# Patient Record
Sex: Female | Born: 1945 | Race: White | Hispanic: No | Marital: Single
Health system: Southern US, Community
[De-identification: ages and names within clinical notes are randomized; demographics above are authoritative.]

---

## 2001-02-11 ENCOUNTER — Ambulatory Visit (HOSPITAL_COMMUNITY): Admission: RE | Admit: 2001-02-11 | Discharge: 2001-02-11 | Payer: Self-pay | Admitting: Neurology

## 2001-02-11 ENCOUNTER — Encounter: Payer: Self-pay | Admitting: Neurology

## 2001-07-20 ENCOUNTER — Inpatient Hospital Stay (HOSPITAL_COMMUNITY): Admission: AD | Admit: 2001-07-20 | Discharge: 2001-07-22 | Payer: Self-pay | Admitting: Neurology

## 2002-12-31 ENCOUNTER — Ambulatory Visit (HOSPITAL_COMMUNITY): Admission: RE | Admit: 2002-12-31 | Discharge: 2002-12-31 | Payer: Self-pay | Admitting: Neurology

## 2002-12-31 ENCOUNTER — Encounter: Payer: Self-pay | Admitting: Neurology

## 2004-04-03 ENCOUNTER — Ambulatory Visit: Payer: Self-pay | Admitting: Family Medicine

## 2005-04-23 ENCOUNTER — Ambulatory Visit: Payer: Self-pay | Admitting: Family Medicine

## 2005-08-03 ENCOUNTER — Emergency Department: Payer: Self-pay | Admitting: Emergency Medicine

## 2005-08-03 ENCOUNTER — Other Ambulatory Visit: Payer: Self-pay

## 2006-04-27 ENCOUNTER — Ambulatory Visit: Payer: Self-pay | Admitting: Family Medicine

## 2006-08-30 ENCOUNTER — Observation Stay: Payer: Self-pay | Admitting: Internal Medicine

## 2006-08-30 ENCOUNTER — Other Ambulatory Visit: Payer: Self-pay

## 2007-05-03 ENCOUNTER — Ambulatory Visit: Payer: Self-pay | Admitting: Family Medicine

## 2008-05-03 ENCOUNTER — Ambulatory Visit: Payer: Self-pay | Admitting: Family Medicine

## 2009-05-13 ENCOUNTER — Ambulatory Visit: Payer: Self-pay | Admitting: Family Medicine

## 2009-06-17 ENCOUNTER — Ambulatory Visit: Payer: Self-pay | Admitting: Family Medicine

## 2010-05-27 ENCOUNTER — Ambulatory Visit: Payer: Self-pay | Admitting: Family Medicine

## 2010-12-03 ENCOUNTER — Other Ambulatory Visit: Payer: Self-pay | Admitting: Neurology

## 2010-12-03 DIAGNOSIS — G40309 Generalized idiopathic epilepsy and epileptic syndromes, not intractable, without status epilepticus: Secondary | ICD-10-CM

## 2010-12-15 ENCOUNTER — Ambulatory Visit
Admission: RE | Admit: 2010-12-15 | Discharge: 2010-12-15 | Disposition: A | Discharge status: Home / Self Care | Payer: Medicare Other | Source: Ambulatory Visit | Attending: Neurology | Admitting: Neurology

## 2010-12-15 DIAGNOSIS — G40309 Generalized idiopathic epilepsy and epileptic syndromes, not intractable, without status epilepticus: Secondary | ICD-10-CM

## 2010-12-15 MED ORDER — GADOBENATE DIMEGLUMINE 529 MG/ML IV SOLN
9.0000 mL | Freq: Once | INTRAVENOUS | Status: AC | PRN
  Administered 2010-12-15: 9 mL via INTRAVENOUS

## 2011-06-26 ENCOUNTER — Ambulatory Visit: Payer: Self-pay | Admitting: Family Medicine

## 2012-03-15 ENCOUNTER — Ambulatory Visit: Payer: Self-pay | Admitting: Radiation Oncology

## 2012-03-17 ENCOUNTER — Ambulatory Visit: Payer: Self-pay | Admitting: Family Medicine

## 2012-03-21 ENCOUNTER — Inpatient Hospital Stay: Payer: Self-pay | Admitting: Internal Medicine

## 2012-03-21 LAB — CBC
HCT: 21.1 % — ABNORMAL LOW (ref 35.0–47.0)
HGB: 7.3 g/dL — ABNORMAL LOW (ref 12.0–16.0)
MCH: 30.2 pg (ref 26.0–34.0)
MCHC: 34.8 g/dL (ref 32.0–36.0)
MCV: 87 fL (ref 80–100)
Platelet: 109 10*3/uL — ABNORMAL LOW (ref 150–440)
RBC: 2.44 10*6/uL — ABNORMAL LOW (ref 3.80–5.20)
RDW: 13.5 % (ref 11.5–14.5)
WBC: 8.3 10*3/uL (ref 3.6–11.0)

## 2012-03-21 LAB — COMPREHENSIVE METABOLIC PANEL
Albumin: 3.3 g/dL — ABNORMAL LOW (ref 3.4–5.0)
Alkaline Phosphatase: 658 U/L — ABNORMAL HIGH (ref 50–136)
Anion Gap: 11 (ref 7–16)
BUN: 11 mg/dL (ref 7–18)
Bilirubin,Total: 0.3 mg/dL (ref 0.2–1.0)
Calcium, Total: 7.6 mg/dL — ABNORMAL LOW (ref 8.5–10.1)
Chloride: 103 mmol/L (ref 98–107)
Co2: 21 mmol/L (ref 21–32)
Creatinine: 0.66 mg/dL (ref 0.60–1.30)
EGFR (African American): >60
EGFR (Non-African Amer.): >60
Glucose: 104 mg/dL — ABNORMAL HIGH (ref 65–99)
Osmolality: 270 (ref 275–301)
Potassium: 3.6 mmol/L (ref 3.5–5.1)
SGOT(AST): 36 U/L (ref 15–37)
SGPT (ALT): 18 U/L (ref 12–78)
Sodium: 135 mmol/L — ABNORMAL LOW (ref 136–145)
Total Protein: 6.2 g/dL — ABNORMAL LOW (ref 6.4–8.2)

## 2012-03-21 LAB — URINALYSIS, COMPLETE
Bilirubin,UR: NEGATIVE
Glucose,UR: NEGATIVE mg/dL (ref 0–75)
Ketone: NEGATIVE
Specific Gravity: 1.017 (ref 1.003–1.030)

## 2012-03-21 LAB — PROTIME-INR
INR: 1.2
Prothrombin Time: 15.5 secs — ABNORMAL HIGH (ref 11.5–14.7)

## 2012-03-21 LAB — APTT: Activated PTT: 31.3 secs (ref 23.6–35.9)

## 2012-03-21 LAB — HEMOGLOBIN: HGB: 6.5 g/dL — ABNORMAL LOW (ref 12.0–16.0)

## 2012-03-22 LAB — COMPREHENSIVE METABOLIC PANEL
Albumin: 2.5 g/dL — ABNORMAL LOW (ref 3.4–5.0)
Alkaline Phosphatase: 569 U/L — ABNORMAL HIGH (ref 50–136)
BUN: 8 mg/dL (ref 7–18)
Bilirubin,Total: 0.3 mg/dL (ref 0.2–1.0)
Calcium, Total: 7.1 mg/dL — ABNORMAL LOW (ref 8.5–10.1)
Co2: 18 mmol/L — ABNORMAL LOW (ref 21–32)
Creatinine: 0.6 mg/dL (ref 0.60–1.30)
EGFR (African American): >60
EGFR (Non-African Amer.): >60
Osmolality: 284 (ref 275–301)
Potassium: 3.6 mmol/L (ref 3.5–5.1)
SGOT(AST): 26 U/L (ref 15–37)
SGPT (ALT): 16 U/L (ref 12–78)
Sodium: 144 mmol/L (ref 136–145)

## 2012-03-22 LAB — CBC WITH DIFFERENTIAL/PLATELET
Basophil %: 0.2 %
Eosinophil #: 0.1 10*3/uL (ref 0.0–0.7)
HCT: 26.7 % — ABNORMAL LOW (ref 35.0–47.0)
HGB: 9.4 g/dL — ABNORMAL LOW (ref 12.0–16.0)
Lymphocyte #: 0.9 10*3/uL — ABNORMAL LOW (ref 1.0–3.6)
MCHC: 35.2 g/dL (ref 32.0–36.0)
MCV: 85 fL (ref 80–100)
Monocyte %: 4.3 %
Neutrophil #: 5.1 10*3/uL (ref 1.4–6.5)
RBC: 3.13 10*6/uL — ABNORMAL LOW (ref 3.80–5.20)
RDW: 13.1 % (ref 11.5–14.5)
WBC: 6.4 10*3/uL (ref 3.6–11.0)

## 2012-03-22 LAB — HEMOGLOBIN: HGB: 9.5 g/dL — ABNORMAL LOW (ref 12.0–16.0)

## 2012-03-23 ENCOUNTER — Ambulatory Visit: Payer: Self-pay | Admitting: Radiation Oncology

## 2012-03-23 LAB — CBC WITH DIFFERENTIAL/PLATELET
Bands: 4 %
Eosinophil: 3 %
HCT: 27.6 % — ABNORMAL LOW (ref 35.0–47.0)
Lymphocytes: 14 %
MCV: 85 fL (ref 80–100)
Metamyelocyte: 2 %
Monocytes: 5 %
Myelocyte: 1 %
RBC: 3.24 10*6/uL — ABNORMAL LOW (ref 3.80–5.20)
Variant Lymphocyte - H1-Rlymph: 2 %
WBC: 7.7 10*3/uL (ref 3.6–11.0)

## 2012-03-23 LAB — PROTIME-INR
INR: 1.2
Prothrombin Time: 16 secs — ABNORMAL HIGH (ref 11.5–14.7)

## 2012-03-23 LAB — PLATELET COUNT: Platelet: 69 10*3/uL — ABNORMAL LOW (ref 150–440)

## 2012-03-24 LAB — CBC WITH DIFFERENTIAL/PLATELET
Comment - H1-Com4: NORMAL
Eosinophil: 1 %
Lymphocytes: 10 %
MCHC: 34.7 g/dL (ref 32.0–36.0)
MCV: 86 fL (ref 80–100)
Monocytes: 2 %
Platelet: 83 10*3/uL — ABNORMAL LOW (ref 150–440)
RBC: 3.61 10*6/uL — ABNORMAL LOW (ref 3.80–5.20)
WBC: 8.2 10*3/uL (ref 3.6–11.0)

## 2012-03-24 LAB — ALKALINE PHOSPHATASE: Alkaline Phosphatase: 871 U/L — ABNORMAL HIGH (ref 50–136)

## 2012-03-25 LAB — CBC WITH DIFFERENTIAL/PLATELET
Bands: 3 %
Eosinophil: 2 %
HCT: 21 % — ABNORMAL LOW (ref 35.0–47.0)
HCT: 20.5 % — ABNORMAL LOW (ref 35.0–47.0)
HGB: 7.7 g/dL — ABNORMAL LOW (ref 12.0–16.0)
HGB: 7.2 g/dL — ABNORMAL LOW (ref 12.0–16.0)
Lymphocyte %: 10.6 %
Lymphocytes: 6 %
Lymphocytes: 12 %
MCH: 31.2 pg (ref 26.0–34.0)
MCH: 30.3 pg (ref 26.0–34.0)
MCHC: 36.8 g/dL — ABNORMAL HIGH (ref 32.0–36.0)
MCHC: 35.2 g/dL (ref 32.0–36.0)
MCV: 85 fL (ref 80–100)
MCV: 86 fL (ref 80–100)
Metamyelocyte: 1 %
Monocytes: 3 %
Monocytes: 1 %
Myelocyte: 1 %
Myelocyte: 2 %
NRBC/100 WBC: 1 /
Neutrophil #: 5.6 10*3/uL (ref 1.4–6.5)
Neutrophil %: 83.7 %
Platelet: 61 10*3/uL — ABNORMAL LOW (ref 150–440)
Platelet: 65 10*3/uL — ABNORMAL LOW (ref 150–440)
RBC: 2.47 10*6/uL — ABNORMAL LOW (ref 3.80–5.20)
RBC: 2.39 10*6/uL — ABNORMAL LOW (ref 3.80–5.20)
RDW: 13.8 % (ref 11.5–14.5)
RDW: 14 % (ref 11.5–14.5)
Segmented Neutrophils: 83 %
WBC: 6.9 10*3/uL (ref 3.6–11.0)
WBC: 6.9 10*3/uL (ref 3.6–11.0)

## 2012-03-25 LAB — BASIC METABOLIC PANEL
BUN: 17 mg/dL (ref 7–18)
Calcium, Total: 7.3 mg/dL — ABNORMAL LOW (ref 8.5–10.1)
Chloride: 114 mmol/L — ABNORMAL HIGH (ref 98–107)
Co2: 20 mmol/L — ABNORMAL LOW (ref 21–32)
Osmolality: 286 (ref 275–301)
Potassium: 4.2 mmol/L (ref 3.5–5.1)

## 2012-03-25 LAB — ALKALINE PHOSPHATASE: Alkaline Phosphatase: 663 U/L — ABNORMAL HIGH (ref 50–136)

## 2012-03-25 LAB — RETICULOCYTES: Absolute Retic Count: 0.1097 10*6/uL — ABNORMAL HIGH (ref 0.023–0.096)

## 2012-03-25 LAB — LACTATE DEHYDROGENASE: LDH: 246 U/L (ref 81–246)

## 2012-03-26 LAB — CBC WITH DIFFERENTIAL/PLATELET
Basophil #: 0 10*3/uL (ref 0.0–0.1)
Basophil %: 0.4 %
Eosinophil: 1 %
HCT: 17.8 % — ABNORMAL LOW (ref 35.0–47.0)
HGB: 6.3 g/dL — ABNORMAL LOW (ref 12.0–16.0)
Lymphocyte #: 0.8 10*3/uL — ABNORMAL LOW (ref 1.0–3.6)
Lymphocyte %: 12 %
Lymphocytes: 9 %
MCHC: 35.3 g/dL (ref 32.0–36.0)
MCV: 85 fL (ref 80–100)
MCV: 87 fL (ref 80–100)
Metamyelocyte: 3 %
Monocyte #: 0.3 x10 3/mm (ref 0.2–0.9)
Monocyte %: 4.9 %
NRBC/100 WBC: 6 /
Neutrophil %: 81.5 %
Platelet: 53 10*3/uL — ABNORMAL LOW (ref 150–440)
RBC: 2.04 10*6/uL — ABNORMAL LOW (ref 3.80–5.20)
RDW: 14.1 % (ref 11.5–14.5)
RDW: 14.4 % (ref 11.5–14.5)
WBC: 6.4 10*3/uL (ref 3.6–11.0)
WBC: 6 10*3/uL (ref 3.6–11.0)

## 2012-03-26 LAB — COMPREHENSIVE METABOLIC PANEL
Albumin: 2.5 g/dL — ABNORMAL LOW (ref 3.4–5.0)
Alkaline Phosphatase: 727 U/L — ABNORMAL HIGH (ref 50–136)
Anion Gap: 12 (ref 7–16)
BUN: 19 mg/dL — ABNORMAL HIGH (ref 7–18)
Calcium, Total: 7.2 mg/dL — ABNORMAL LOW (ref 8.5–10.1)
Chloride: 114 mmol/L — ABNORMAL HIGH (ref 98–107)
EGFR (Non-African Amer.): >60
Glucose: 119 mg/dL — ABNORMAL HIGH (ref 65–99)
SGOT(AST): 23 U/L (ref 15–37)
Sodium: 145 mmol/L (ref 136–145)

## 2012-03-26 LAB — MAGNESIUM: Magnesium: 1.4 mg/dL — ABNORMAL LOW

## 2012-03-27 LAB — CBC WITH DIFFERENTIAL/PLATELET
Eosinophil: 2 %
HCT: 25.2 % — ABNORMAL LOW (ref 35.0–47.0)
HCT: 24.4 % — ABNORMAL LOW (ref 35.0–47.0)
HGB: 9 g/dL — ABNORMAL LOW (ref 12.0–16.0)
HGB: 8.6 g/dL — ABNORMAL LOW (ref 12.0–16.0)
Lymphocytes: 10 %
Lymphocytes: 12 %
MCH: 31 pg (ref 26.0–34.0)
MCHC: 35.6 g/dL (ref 32.0–36.0)
MCV: 87 fL (ref 80–100)
Metamyelocyte: 3 %
Monocytes: 8 %
Myelocyte: 1 %
NRBC/100 WBC: 8 /
Platelet: 46 10*3/uL — ABNORMAL LOW (ref 150–440)
RDW: 14.5 % (ref 11.5–14.5)
RDW: 14.6 % — ABNORMAL HIGH (ref 11.5–14.5)
Segmented Neutrophils: 64 %
WBC: 4 10*3/uL (ref 3.6–11.0)
WBC: 4.3 10*3/uL (ref 3.6–11.0)

## 2012-03-27 LAB — BASIC METABOLIC PANEL
BUN: 21 mg/dL — ABNORMAL HIGH (ref 7–18)
Calcium, Total: 7 mg/dL — CL (ref 8.5–10.1)
Co2: 19 mmol/L — ABNORMAL LOW (ref 21–32)
Creatinine: 0.59 mg/dL — ABNORMAL LOW (ref 0.60–1.30)
EGFR (Non-African Amer.): >60
Osmolality: 289 (ref 275–301)

## 2012-03-28 LAB — CBC WITH DIFFERENTIAL/PLATELET
Bands: 18 %
Bands: 4 %
Comment - H1-Com4: NORMAL
Comment - H1-Com4: NORMAL
Eosinophil: 2 %
Eosinophil: 4 %
HCT: 23.3 % — ABNORMAL LOW (ref 35.0–47.0)
HCT: 24.5 % — ABNORMAL LOW (ref 35.0–47.0)
HGB: 8.2 g/dL — ABNORMAL LOW (ref 12.0–16.0)
HGB: 8.7 g/dL — ABNORMAL LOW (ref 12.0–16.0)
Lymphocytes: 15 %
Lymphocytes: 18 %
MCH: 31 pg (ref 26.0–34.0)
MCH: 30.7 pg (ref 26.0–34.0)
MCHC: 34.7 g/dL (ref 32.0–36.0)
MCHC: 35.4 g/dL (ref 32.0–36.0)
MCV: 88 fL (ref 80–100)
MCV: 89 fL (ref 80–100)
Metamyelocyte: 3 %
Metamyelocyte: 4 %
Monocytes: 2 %
Monocytes: 3 %
Monocytes: 4 %
Myelocyte: 4 %
NRBC/100 WBC: 7 /
Platelet: 33 10*3/uL — ABNORMAL LOW (ref 150–440)
Platelet: 42 10*3/uL — ABNORMAL LOW (ref 150–440)
Platelet: 44 10*3/uL — ABNORMAL LOW (ref 150–440)
RBC: 1.77 10*6/uL — ABNORMAL LOW (ref 3.80–5.20)
RBC: 2.64 10*6/uL — ABNORMAL LOW (ref 3.80–5.20)
RBC: 2.78 10*6/uL — ABNORMAL LOW (ref 3.80–5.20)
Segmented Neutrophils: 68 %
WBC: 5.5 10*3/uL (ref 3.6–11.0)
WBC: 5.6 10*3/uL (ref 3.6–11.0)

## 2012-03-28 LAB — FOLATE: Folic Acid: 11.8 ng/mL (ref 3.1–100.0)

## 2012-03-28 LAB — BASIC METABOLIC PANEL
Anion Gap: 10 (ref 7–16)
Calcium, Total: 7.1 mg/dL — ABNORMAL LOW (ref 8.5–10.1)
Co2: 17 mmol/L — ABNORMAL LOW (ref 21–32)
Creatinine: 0.46 mg/dL — ABNORMAL LOW (ref 0.60–1.30)
EGFR (African American): >60
EGFR (Non-African Amer.): >60
Glucose: 105 mg/dL — ABNORMAL HIGH (ref 65–99)

## 2012-03-28 LAB — PROTIME-INR: INR: 1.5

## 2012-03-29 LAB — CBC WITH DIFFERENTIAL/PLATELET
Bands: 8 %
Bands: 21 %
Comment - H1-Com3: NORMAL
Eosinophil: 1 %
Eosinophil: 1 %
HCT: 23.4 % — ABNORMAL LOW (ref 35.0–47.0)
HCT: 31.4 % — ABNORMAL LOW (ref 35.0–47.0)
HGB: 11.4 g/dL — ABNORMAL LOW (ref 12.0–16.0)
Lymphocytes: 12 %
MCV: 89 fL (ref 80–100)
Metamyelocyte: 5 %
Monocytes: 2 %
Monocytes: 1 %
RBC: 2.63 10*6/uL — ABNORMAL LOW (ref 3.80–5.20)
RBC: 3.65 10*6/uL — ABNORMAL LOW (ref 3.80–5.20)
RDW: 14.7 % — ABNORMAL HIGH (ref 11.5–14.5)
Segmented Neutrophils: 67 %
Segmented Neutrophils: 60 %
WBC: 4.9 10*3/uL (ref 3.6–11.0)
WBC: 5.8 10*3/uL (ref 3.6–11.0)

## 2012-03-29 LAB — BASIC METABOLIC PANEL
Anion Gap: 10 (ref 7–16)
BUN: 16 mg/dL (ref 7–18)
Calcium, Total: 6.8 mg/dL — CL (ref 8.5–10.1)
Co2: 19 mmol/L — ABNORMAL LOW (ref 21–32)
Creatinine: 0.54 mg/dL — ABNORMAL LOW (ref 0.60–1.30)
EGFR (African American): >60
EGFR (Non-African Amer.): >60
Glucose: 99 mg/dL (ref 65–99)
Osmolality: 286 (ref 275–301)
Sodium: 143 mmol/L (ref 136–145)

## 2012-03-29 LAB — KAPPA/LAMBDA FREE LIGHT CHAINS (ARMC)

## 2012-03-29 LAB — RETICULOCYTES: Absolute Retic Count: 0.1142 10*6/uL — ABNORMAL HIGH (ref 0.023–0.096)

## 2012-03-29 LAB — PROT IMMUNOELECTROPHORES(ARMC)

## 2012-03-30 LAB — CBC WITH DIFFERENTIAL/PLATELET
Bands: 17 %
Bands: 5 %
Comment - H1-Com5: NORMAL
Eosinophil #: 0.1 10*3/uL (ref 0.0–0.7)
Eosinophil %: 1.1 %
Eosinophil: 1 %
Eosinophil: 3 %
Eosinophil: 3 %
HCT: 30.3 % — ABNORMAL LOW (ref 35.0–47.0)
HGB: 11 g/dL — ABNORMAL LOW (ref 12.0–16.0)
HGB: 10.9 g/dL — ABNORMAL LOW (ref 12.0–16.0)
Lymphocyte %: 15 %
Lymphocytes: 13 %
Lymphocytes: 11 %
Lymphocytes: 11 %
MCH: 31.4 pg (ref 26.0–34.0)
MCH: 31.2 pg (ref 26.0–34.0)
MCV: 86 fL (ref 80–100)
Metamyelocyte: 2 %
Monocyte #: 0.4 x10 3/mm (ref 0.2–0.9)
Monocyte %: 6.3 %
Monocytes: 1 %
Monocytes: 5 %
Monocytes: 4 %
Myelocyte: 1 %
NRBC/100 WBC: 9 /
NRBC/100 WBC: 14 /
Neutrophil %: 77.3 %
Platelet: 47 10*3/uL — ABNORMAL LOW (ref 150–440)
Platelet: 35 10*3/uL — ABNORMAL LOW (ref 150–440)
RBC: 3.51 10*6/uL — ABNORMAL LOW (ref 3.80–5.20)
RBC: 3.5 10*6/uL — ABNORMAL LOW (ref 3.80–5.20)
RDW: 15.4 % — ABNORMAL HIGH (ref 11.5–14.5)
RDW: 15.4 % — ABNORMAL HIGH (ref 11.5–14.5)
Segmented Neutrophils: 73 %
Segmented Neutrophils: 72 %
Variant Lymphocyte - H1-Rlymph: 3 %
WBC: 6.8 10*3/uL (ref 3.6–11.0)
WBC: 6.6 10*3/uL (ref 3.6–11.0)
WBC: 6.9 10*3/uL (ref 3.6–11.0)

## 2012-03-30 LAB — BASIC METABOLIC PANEL
Anion Gap: 10 (ref 7–16)
BUN: 11 mg/dL (ref 7–18)
Calcium, Total: 7.6 mg/dL — ABNORMAL LOW (ref 8.5–10.1)
Creatinine: 0.5 mg/dL — ABNORMAL LOW (ref 0.60–1.30)
EGFR (African American): >60
EGFR (Non-African Amer.): >60
Glucose: 95 mg/dL (ref 65–99)
Osmolality: 282 (ref 275–301)
Potassium: 3.5 mmol/L (ref 3.5–5.1)

## 2012-03-31 LAB — BASIC METABOLIC PANEL
Anion Gap: 11 (ref 7–16)
BUN: 9 mg/dL (ref 7–18)
Co2: 19 mmol/L — ABNORMAL LOW (ref 21–32)
EGFR (African American): >60
EGFR (Non-African Amer.): >60
Glucose: 91 mg/dL (ref 65–99)
Osmolality: 280 (ref 275–301)
Sodium: 141 mmol/L (ref 136–145)

## 2012-03-31 LAB — CBC WITH DIFFERENTIAL/PLATELET
Bands: 10 %
Bands: 11 %
HCT: 27.3 % — ABNORMAL LOW (ref 35.0–47.0)
HCT: 26.4 % — ABNORMAL LOW (ref 35.0–47.0)
HGB: 9.9 g/dL — ABNORMAL LOW (ref 12.0–16.0)
HGB: 9.6 g/dL — ABNORMAL LOW (ref 12.0–16.0)
Lymphocytes: 12 %
Lymphocytes: 16 %
Lymphocytes: 12 %
MCH: 30.6 pg (ref 26.0–34.0)
MCH: 31.1 pg (ref 26.0–34.0)
MCV: 87 fL (ref 80–100)
MCV: 86 fL (ref 80–100)
Metamyelocyte: 3 %
Metamyelocyte: 4 %
Monocytes: 1 %
Myelocyte: 1 %
NRBC/100 WBC: 13 /
Platelet: 68 10*3/uL — ABNORMAL LOW (ref 150–440)
RBC: 3.15 10*6/uL — ABNORMAL LOW (ref 3.80–5.20)
RBC: 3.07 10*6/uL — ABNORMAL LOW (ref 3.80–5.20)
RDW: 15.4 % — ABNORMAL HIGH (ref 11.5–14.5)
RDW: 16.1 % — ABNORMAL HIGH (ref 11.5–14.5)
Segmented Neutrophils: 62 %
Variant Lymphocyte - H1-Rlymph: 1 %
WBC: 4.9 10*3/uL (ref 3.6–11.0)
WBC: 5.4 10*3/uL (ref 3.6–11.0)
WBC: 4.8 10*3/uL (ref 3.6–11.0)

## 2012-03-31 LAB — POTASSIUM: Potassium: 2.8 mmol/L — ABNORMAL LOW (ref 3.5–5.1)

## 2012-04-01 LAB — CBC WITH DIFFERENTIAL/PLATELET
Bands: 5 %
Bands: 9 %
Bands: 8 %
Eosinophil: 2 %
HCT: 22.4 % — ABNORMAL LOW (ref 35.0–47.0)
HCT: 18.3 % — ABNORMAL LOW (ref 35.0–47.0)
HGB: 8.4 g/dL — ABNORMAL LOW (ref 12.0–16.0)
HGB: 8.1 g/dL — ABNORMAL LOW (ref 12.0–16.0)
Lymphocytes: 8 %
Lymphocytes: 20 %
Lymphocytes: 20 %
Lymphocytes: 15 %
MCH: 31.4 pg (ref 26.0–34.0)
MCHC: 34.9 g/dL (ref 32.0–36.0)
MCHC: 36.1 g/dL — ABNORMAL HIGH (ref 32.0–36.0)
MCHC: 36.2 g/dL — ABNORMAL HIGH (ref 32.0–36.0)
MCV: 87 fL (ref 80–100)
MCV: 87 fL (ref 80–100)
MCV: 87 fL (ref 80–100)
MCV: 87 fL (ref 80–100)
Metamyelocyte: 10 %
Metamyelocyte: 2 %
Monocytes: 2 %
Monocytes: 7 %
Monocytes: 3 %
Myelocyte: 3 %
NRBC/100 WBC: 5 /
Platelet: 35 10*3/uL — ABNORMAL LOW (ref 150–440)
Platelet: 50 10*3/uL — ABNORMAL LOW (ref 150–440)
Platelet: 66 10*3/uL — ABNORMAL LOW (ref 150–440)
Platelet: 49 10*3/uL — ABNORMAL LOW (ref 150–440)
RBC: 2.78 10*6/uL — ABNORMAL LOW (ref 3.80–5.20)
RBC: 2.57 10*6/uL — ABNORMAL LOW (ref 3.80–5.20)
RDW: 16 % — ABNORMAL HIGH (ref 11.5–14.5)
RDW: 15.9 % — ABNORMAL HIGH (ref 11.5–14.5)
Segmented Neutrophils: 61 %
Segmented Neutrophils: 68 %
Segmented Neutrophils: 66 %
Variant Lymphocyte - H1-Rlymph: 1 %
Variant Lymphocyte - H1-Rlymph: 2 %
WBC: 5.1 10*3/uL (ref 3.6–11.0)
WBC: 4.2 10*3/uL (ref 3.6–11.0)
WBC: 4.2 10*3/uL (ref 3.6–11.0)
WBC: 4.4 10*3/uL (ref 3.6–11.0)

## 2012-04-01 LAB — POTASSIUM: Potassium: 3.8 mmol/L (ref 3.5–5.1)

## 2012-04-01 LAB — COMPREHENSIVE METABOLIC PANEL
Albumin: 2.9 g/dL — ABNORMAL LOW (ref 3.4–5.0)
Alkaline Phosphatase: 816 U/L — ABNORMAL HIGH (ref 50–136)
Calcium, Total: 8.1 mg/dL — ABNORMAL LOW (ref 8.5–10.1)
Chloride: 112 mmol/L — ABNORMAL HIGH (ref 98–107)
Co2: 21 mmol/L (ref 21–32)
EGFR (African American): >60
EGFR (Non-African Amer.): >60
Glucose: 114 mg/dL — ABNORMAL HIGH (ref 65–99)
Osmolality: 282 (ref 275–301)
SGOT(AST): 34 U/L (ref 15–37)
SGPT (ALT): 19 U/L (ref 12–78)
Sodium: 142 mmol/L (ref 136–145)

## 2012-04-01 LAB — CEA: CEA: 2.6 ng/mL (ref 0.0–4.7)

## 2012-04-01 LAB — LACTATE DEHYDROGENASE: LDH: 593 U/L — ABNORMAL HIGH (ref 81–246)

## 2012-04-02 LAB — CBC WITH DIFFERENTIAL/PLATELET
Bands: 18 %
Bands: 10 %
Basophil %: 0.8 %
Eosinophil %: 1.9 %
Eosinophil: 2 %
HCT: 20.4 % — ABNORMAL LOW (ref 35.0–47.0)
HGB: 5.6 g/dL — ABNORMAL LOW (ref 12.0–16.0)
HGB: 7.4 g/dL — ABNORMAL LOW (ref 12.0–16.0)
Lymphocyte #: 0.7 10*3/uL — ABNORMAL LOW (ref 1.0–3.6)
Lymphocyte %: 16.3 %
Lymphocytes: 7 %
Lymphocytes: 10 %
MCH: 30.5 pg (ref 26.0–34.0)
MCH: 31.7 pg (ref 26.0–34.0)
MCV: 88 fL (ref 80–100)
Metamyelocyte: 10 %
Metamyelocyte: 9 %
Monocyte #: 0.3 x10 3/mm (ref 0.2–0.9)
Monocyte %: 6.2 %
Monocytes: 3 %
Monocytes: 5 %
Myelocyte: 1 %
Myelocyte: 2 %
NRBC/100 WBC: 13 /
NRBC/100 WBC: 6 /
Neutrophil #: 3.3 10*3/uL (ref 1.4–6.5)
Platelet: 38 10*3/uL — ABNORMAL LOW (ref 150–440)
RBC: 1.83 10*6/uL — ABNORMAL LOW (ref 3.80–5.20)
RBC: 2.33 10*6/uL — ABNORMAL LOW (ref 3.80–5.20)
RDW: 15.3 % — ABNORMAL HIGH (ref 11.5–14.5)
Segmented Neutrophils: 61 %
WBC: 4.3 10*3/uL (ref 3.6–11.0)
WBC: 4.4 10*3/uL (ref 3.6–11.0)

## 2012-04-02 LAB — BASIC METABOLIC PANEL
Anion Gap: 9 (ref 7–16)
BUN: 10 mg/dL (ref 7–18)
Co2: 19 mmol/L — ABNORMAL LOW (ref 21–32)
Creatinine: 0.58 mg/dL — ABNORMAL LOW (ref 0.60–1.30)
EGFR (African American): >60
Glucose: 96 mg/dL (ref 65–99)
Potassium: 3.8 mmol/L (ref 3.5–5.1)
Sodium: 142 mmol/L (ref 136–145)

## 2012-04-02 LAB — HEMOGLOBIN: HGB: 7.4 g/dL — ABNORMAL LOW (ref 12.0–16.0)

## 2012-04-02 LAB — PLATELET COUNT
Platelet: 53 10*3/uL — ABNORMAL LOW (ref 150–440)
Platelet: 40 10*3/uL — ABNORMAL LOW (ref 150–440)

## 2012-04-03 LAB — CBC WITH DIFFERENTIAL/PLATELET
Bands: 12 %
Comment - H1-Com4: NORMAL
Eosinophil #: 0.1 10*3/uL (ref 0.0–0.7)
HCT: 23.3 % — ABNORMAL LOW (ref 35.0–47.0)
Lymphocyte %: 16.6 %
Lymphocytes: 11 %
MCHC: 35.4 g/dL (ref 32.0–36.0)
Monocyte #: 0.3 x10 3/mm (ref 0.2–0.9)
Monocyte %: 5.7 %
Neutrophil %: 74.5 %
Platelet: 62 10*3/uL — ABNORMAL LOW (ref 150–440)
RDW: 15.3 % — ABNORMAL HIGH (ref 11.5–14.5)
Segmented Neutrophils: 62 %
WBC: 5 10*3/uL (ref 3.6–11.0)

## 2012-04-03 LAB — PLATELET COUNT: Platelet: 36 10*3/uL — ABNORMAL LOW (ref 150–440)

## 2012-04-04 LAB — COMPREHENSIVE METABOLIC PANEL
Albumin: 2.2 g/dL — ABNORMAL LOW (ref 3.4–5.0)
Anion Gap: 9 (ref 7–16)
BUN: 16 mg/dL (ref 7–18)
Calcium, Total: 6.9 mg/dL — CL (ref 8.5–10.1)
Chloride: 115 mmol/L — ABNORMAL HIGH (ref 98–107)
Co2: 21 mmol/L (ref 21–32)
Glucose: 98 mg/dL (ref 65–99)
Osmolality: 290 (ref 275–301)
Potassium: 3.4 mmol/L — ABNORMAL LOW (ref 3.5–5.1)
SGOT(AST): 41 U/L — ABNORMAL HIGH (ref 15–37)
SGPT (ALT): 20 U/L (ref 12–78)

## 2012-04-04 LAB — CBC WITH DIFFERENTIAL/PLATELET
Bands: 6 %
HCT: 20.9 % — ABNORMAL LOW (ref 35.0–47.0)
Lymphocytes: 15 %
MCH: 29.6 pg (ref 26.0–34.0)
MCV: 85 fL (ref 80–100)
Metamyelocyte: 7 %
Monocytes: 2 %
Myelocyte: 3 %
RBC: 2.46 10*6/uL — ABNORMAL LOW (ref 3.80–5.20)
RDW: 15.5 % — ABNORMAL HIGH (ref 11.5–14.5)
Segmented Neutrophils: 66 %
WBC: 6.4 10*3/uL (ref 3.6–11.0)

## 2012-04-05 LAB — CBC WITH DIFFERENTIAL/PLATELET
Eosinophil: 1 %
MCH: 29.5 pg (ref 26.0–34.0)
MCV: 88 fL (ref 80–100)
Metamyelocyte: 2 %
Monocytes: 3 %
NRBC/100 WBC: 11 /
Other Cells Blood: 3
Platelet: 49 10*3/uL — ABNORMAL LOW (ref 150–440)
RBC: 1.62 10*6/uL — ABNORMAL LOW (ref 3.80–5.20)
Segmented Neutrophils: 69 %
WBC: 5.4 10*3/uL (ref 3.6–11.0)

## 2012-04-05 LAB — BASIC METABOLIC PANEL
Anion Gap: 11 (ref 7–16)
Chloride: 115 mmol/L — ABNORMAL HIGH (ref 98–107)
Co2: 19 mmol/L — ABNORMAL LOW (ref 21–32)
Creatinine: 0.55 mg/dL — ABNORMAL LOW (ref 0.60–1.30)
EGFR (African American): >60
EGFR (Non-African Amer.): >60
Potassium: 3.7 mmol/L (ref 3.5–5.1)
Sodium: 145 mmol/L (ref 136–145)

## 2012-04-05 LAB — HEMOGLOBIN: HGB: 9.1 g/dL — ABNORMAL LOW (ref 12.0–16.0)

## 2012-04-06 LAB — BASIC METABOLIC PANEL
Anion Gap: 11 (ref 7–16)
Calcium, Total: 6.6 mg/dL — CL (ref 8.5–10.1)
Chloride: 117 mmol/L — ABNORMAL HIGH (ref 98–107)
Co2: 18 mmol/L — ABNORMAL LOW (ref 21–32)
Glucose: 193 mg/dL — ABNORMAL HIGH (ref 65–99)
Osmolality: 297 (ref 275–301)
Potassium: 3.7 mmol/L (ref 3.5–5.1)

## 2012-04-06 LAB — CBC WITH DIFFERENTIAL/PLATELET
Bands: 10 %
HCT: 20.8 % — ABNORMAL LOW (ref 35.0–47.0)
Lymphocytes: 15 %
MCHC: 35.1 g/dL (ref 32.0–36.0)
MCV: 89 fL (ref 80–100)
Monocytes: 7 %
RDW: 13.8 % (ref 11.5–14.5)
Segmented Neutrophils: 61 %
WBC: 7.5 10*3/uL (ref 3.6–11.0)

## 2012-04-06 LAB — TRIGLYCERIDES: Triglycerides: 104 mg/dL (ref 0–200)

## 2012-04-06 LAB — HEMOGLOBIN: HGB: 7.7 g/dL — ABNORMAL LOW (ref 12.0–16.0)

## 2012-04-06 LAB — MAGNESIUM: Magnesium: 1.5 mg/dL — ABNORMAL LOW

## 2012-04-07 LAB — PROTIME-INR: Prothrombin Time: 15.5 secs — ABNORMAL HIGH (ref 11.5–14.7)

## 2012-04-07 LAB — CBC WITH DIFFERENTIAL/PLATELET
Bands: 1 %
Lymphocytes: 12 %
MCH: 30.7 pg (ref 26.0–34.0)
MCHC: 34.1 g/dL (ref 32.0–36.0)
MCV: 90 fL (ref 80–100)
Metamyelocyte: 2 %
Monocytes: 3 %
Myelocyte: 1 %
Platelet: 42 10*3/uL — ABNORMAL LOW (ref 150–440)
RBC: 2.57 10*6/uL — ABNORMAL LOW (ref 3.80–5.20)
RDW: 14.7 % — ABNORMAL HIGH (ref 11.5–14.5)
Segmented Neutrophils: 72 %
WBC: 6.8 10*3/uL (ref 3.6–11.0)

## 2012-04-07 LAB — MAGNESIUM: Magnesium: 1.9 mg/dL

## 2012-04-07 LAB — POTASSIUM: Potassium: 3.8 mmol/L (ref 3.5–5.1)

## 2012-04-08 LAB — CBC WITH DIFFERENTIAL/PLATELET
HCT: 23.2 % — ABNORMAL LOW (ref 35.0–47.0)
HGB: 7.4 g/dL — ABNORMAL LOW (ref 12.0–16.0)
Lymphocytes: 14 %
Metamyelocyte: 1 %
Myelocyte: 1 %
RDW: 15.2 % — ABNORMAL HIGH (ref 11.5–14.5)
Segmented Neutrophils: 71 %
WBC: 8.6 10*3/uL (ref 3.6–11.0)

## 2012-04-08 LAB — POTASSIUM: Potassium: 3.8 mmol/L (ref 3.5–5.1)

## 2012-04-08 LAB — PHOSPHORUS: Phosphorus: 2 mg/dL — ABNORMAL LOW (ref 2.5–4.9)

## 2012-04-08 LAB — MAGNESIUM: Magnesium: 1.9 mg/dL

## 2012-04-08 LAB — PLATELET COUNT: Platelet: 47 10*3/uL — ABNORMAL LOW (ref 150–440)

## 2012-04-09 LAB — CBC WITH DIFFERENTIAL/PLATELET
HCT: 20.3 % — ABNORMAL LOW (ref 35.0–47.0)
HGB: 7.1 g/dL — ABNORMAL LOW (ref 12.0–16.0)
Lymphocytes: 8 %
MCH: 32 pg (ref 26.0–34.0)
MCHC: 34.9 g/dL (ref 32.0–36.0)
MCV: 92 fL (ref 80–100)
Metamyelocyte: 2 %
Monocytes: 3 %
Myelocyte: 1 %
NRBC/100 WBC: 27 /
RDW: 15.2 % — ABNORMAL HIGH (ref 11.5–14.5)
Segmented Neutrophils: 75 %
WBC: 6.9 10*3/uL (ref 3.6–11.0)

## 2012-04-09 LAB — BASIC METABOLIC PANEL
Anion Gap: 10 (ref 7–16)
Chloride: 115 mmol/L — ABNORMAL HIGH (ref 98–107)
Co2: 20 mmol/L — ABNORMAL LOW (ref 21–32)
Creatinine: 0.41 mg/dL — ABNORMAL LOW (ref 0.60–1.30)
EGFR (African American): >60
EGFR (Non-African Amer.): >60
Glucose: 144 mg/dL — ABNORMAL HIGH (ref 65–99)
Sodium: 145 mmol/L (ref 136–145)

## 2012-04-09 LAB — MAGNESIUM: Magnesium: 2 mg/dL

## 2012-04-10 LAB — CBC WITH DIFFERENTIAL/PLATELET
HCT: 19.5 % — ABNORMAL LOW (ref 35.0–47.0)
HGB: 6.5 g/dL — ABNORMAL LOW (ref 12.0–16.0)
Lymphocytes: 11 %
MCV: 93 fL (ref 80–100)
Monocytes: 5 %
Myelocyte: 1 %
NRBC/100 WBC: 52 /
Platelet: 10 10*3/uL — CL (ref 150–440)
RDW: 16.5 % — ABNORMAL HIGH (ref 11.5–14.5)
WBC: 7.6 10*3/uL (ref 3.6–11.0)

## 2012-04-10 LAB — LACTATE DEHYDROGENASE: LDH: 970 U/L — ABNORMAL HIGH (ref 81–246)

## 2012-04-10 LAB — MAGNESIUM: Magnesium: 2.2 mg/dL

## 2012-04-10 LAB — POTASSIUM: Potassium: 4.1 mmol/L (ref 3.5–5.1)

## 2012-04-10 LAB — PHOSPHORUS: Phosphorus: 2.3 mg/dL — ABNORMAL LOW (ref 2.5–4.9)

## 2012-04-11 LAB — CBC WITH DIFFERENTIAL/PLATELET
Bands: 15 %
HCT: 23.6 % — ABNORMAL LOW (ref 35.0–47.0)
Lymphocytes: 8 %
MCHC: 35.6 g/dL (ref 32.0–36.0)
Platelet: 36 10*3/uL — ABNORMAL LOW (ref 150–440)
RBC: 2.56 10*6/uL — ABNORMAL LOW (ref 3.80–5.20)
RDW: 16.2 % — ABNORMAL HIGH (ref 11.5–14.5)
Segmented Neutrophils: 61 %
WBC: 7.6 10*3/uL (ref 3.6–11.0)

## 2012-04-11 LAB — MAGNESIUM: Magnesium: 2.5 mg/dL — ABNORMAL HIGH

## 2012-04-11 LAB — ALBUMIN: Albumin: 2.4 g/dL — ABNORMAL LOW (ref 3.4–5.0)

## 2012-04-11 LAB — CALCIUM: Calcium, Total: 6.8 mg/dL — CL (ref 8.5–10.1)

## 2012-04-11 LAB — POTASSIUM: Potassium: 4.2 mmol/L (ref 3.5–5.1)

## 2012-04-12 LAB — CBC WITH DIFFERENTIAL/PLATELET
Bands: 11 %
Bands: 9 %
HGB: 7.1 g/dL — ABNORMAL LOW (ref 12.0–16.0)
Lymphocytes: 7 %
Lymphocytes: 11 %
MCH: 31.4 pg (ref 26.0–34.0)
MCHC: 35 g/dL (ref 32.0–36.0)
MCHC: 34 g/dL (ref 32.0–36.0)
MCV: 92 fL (ref 80–100)
Metamyelocyte: 8 %
Metamyelocyte: 6 %
Monocytes: 2 %
NRBC/100 WBC: 62 /
Platelet: 21 10*3/uL — CL (ref 150–440)
RBC: 2.28 10*6/uL — ABNORMAL LOW (ref 3.80–5.20)
RBC: 2.26 10*6/uL — ABNORMAL LOW (ref 3.80–5.20)
RDW: 17.3 % — ABNORMAL HIGH (ref 11.5–14.5)
Segmented Neutrophils: 68 %
Segmented Neutrophils: 68 %
WBC: 10.4 10*3/uL (ref 3.6–11.0)
WBC: 6.8 10*3/uL (ref 3.6–11.0)

## 2012-04-12 LAB — BASIC METABOLIC PANEL
Calcium, Total: 7.3 mg/dL — ABNORMAL LOW (ref 8.5–10.1)
Co2: 21 mmol/L (ref 21–32)
EGFR (African American): >60
EGFR (Non-African Amer.): >60
Glucose: 136 mg/dL — ABNORMAL HIGH (ref 65–99)
Osmolality: 292 (ref 275–301)
Sodium: 144 mmol/L (ref 136–145)

## 2012-04-12 LAB — PHOSPHORUS: Phosphorus: 3.1 mg/dL (ref 2.5–4.9)

## 2012-04-12 LAB — MAGNESIUM: Magnesium: 2.2 mg/dL

## 2012-04-13 LAB — MAGNESIUM: Magnesium: 2.5 mg/dL — ABNORMAL HIGH

## 2012-04-13 LAB — CBC WITH DIFFERENTIAL/PLATELET
HCT: 18.5 % — ABNORMAL LOW (ref 35.0–47.0)
HGB: 6.3 g/dL — ABNORMAL LOW (ref 12.0–16.0)
Lymphocytes: 9 %
MCV: 93 fL (ref 80–100)
Metamyelocyte: 2 %
Monocytes: 9 %
NRBC/100 WBC: 37 /
Other Cells Blood: 100
Segmented Neutrophils: 75 %
WBC: 6.9 10*3/uL (ref 3.6–11.0)

## 2012-04-13 LAB — SODIUM: Sodium: 140 mmol/L (ref 136–145)

## 2012-04-13 LAB — PHOSPHORUS: Phosphorus: 3.2 mg/dL (ref 2.5–4.9)

## 2012-04-13 LAB — POTASSIUM: Potassium: 4.3 mmol/L (ref 3.5–5.1)

## 2012-04-14 LAB — CBC WITH DIFFERENTIAL/PLATELET
Bands: 6 %
HCT: 24.5 % — ABNORMAL LOW (ref 35.0–47.0)
HGB: 7.9 g/dL — ABNORMAL LOW (ref 12.0–16.0)
Lymphocytes: 10 %
MCV: 93 fL (ref 80–100)
Metamyelocyte: 4 %
Monocytes: 9 %
NRBC/100 WBC: 71 /
RBC: 2.65 10*6/uL — ABNORMAL LOW (ref 3.80–5.20)
RDW: 16.2 % — ABNORMAL HIGH (ref 11.5–14.5)
WBC: 7.2 10*3/uL (ref 3.6–11.0)

## 2012-04-14 LAB — POTASSIUM: Potassium: 4.7 mmol/L (ref 3.5–5.1)

## 2012-04-14 LAB — MAGNESIUM: Magnesium: 2.7 mg/dL — ABNORMAL HIGH

## 2012-04-14 LAB — SODIUM: Sodium: 142 mmol/L (ref 136–145)

## 2012-04-14 LAB — PHOSPHORUS: Phosphorus: 3.2 mg/dL (ref 2.5–4.9)

## 2012-04-15 ENCOUNTER — Ambulatory Visit: Payer: Self-pay | Admitting: Radiation Oncology

## 2012-04-15 LAB — CBC WITH DIFFERENTIAL/PLATELET
HGB: 8.2 g/dL — ABNORMAL LOW (ref 12.0–16.0)
Lymphocytes: 9 %
MCH: 31.6 pg (ref 26.0–34.0)
MCHC: 34.2 g/dL (ref 32.0–36.0)
MCV: 93 fL (ref 80–100)
Metamyelocyte: 6 %
Monocytes: 2 %
Myelocyte: 1 %
NRBC/100 WBC: 65 /
Platelet: 20 10*3/uL — CL (ref 150–440)
RBC: 2.6 10*6/uL — ABNORMAL LOW (ref 3.80–5.20)
Segmented Neutrophils: 74 %

## 2012-04-15 LAB — SODIUM: Sodium: 143 mmol/L (ref 136–145)

## 2012-04-15 LAB — MAGNESIUM: Magnesium: 2.4 mg/dL

## 2012-04-15 LAB — PHOSPHORUS: Phosphorus: 2.6 mg/dL (ref 2.5–4.9)

## 2012-04-16 LAB — CBC WITH DIFFERENTIAL/PLATELET
Bands: 1 %
Comment - H1-Com7: NORMAL
MCHC: 34.3 g/dL (ref 32.0–36.0)
NRBC/100 WBC: 31 /
RDW: 28.2 % — ABNORMAL HIGH (ref 11.5–14.5)
Segmented Neutrophils: 84 %
WBC: 8.4 10*3/uL (ref 3.6–11.0)

## 2012-04-16 LAB — MAGNESIUM: Magnesium: 2.4 mg/dL

## 2012-04-16 LAB — SODIUM: Sodium: 140 mmol/L (ref 136–145)

## 2012-04-16 LAB — POTASSIUM: Potassium: 4.5 mmol/L (ref 3.5–5.1)

## 2012-04-17 LAB — CALCIUM: Calcium, Total: 7.4 mg/dL — ABNORMAL LOW (ref 8.5–10.1)

## 2012-04-17 LAB — POTASSIUM: Potassium: 4.3 mmol/L (ref 3.5–5.1)

## 2012-04-17 LAB — MAGNESIUM: Magnesium: 2.4 mg/dL

## 2012-04-18 LAB — CBC WITH DIFFERENTIAL/PLATELET
Bands: 4 %
HCT: 22.3 % — ABNORMAL LOW (ref 35.0–47.0)
HGB: 7.5 g/dL — ABNORMAL LOW (ref 12.0–16.0)
MCH: 31.1 pg (ref 26.0–34.0)
MCV: 93 fL (ref 80–100)
Metamyelocyte: 3 %
Monocytes: 3 %
NRBC/100 WBC: 36 /
RBC: 2.4 10*6/uL — ABNORMAL LOW (ref 3.80–5.20)
RDW: 26.2 % — ABNORMAL HIGH (ref 11.5–14.5)

## 2012-04-18 LAB — MAGNESIUM: Magnesium: 2.3 mg/dL

## 2012-04-18 LAB — CALCIUM: Calcium, Total: 7.1 mg/dL — ABNORMAL LOW (ref 8.5–10.1)

## 2012-04-18 LAB — SODIUM: Sodium: 142 mmol/L (ref 136–145)

## 2012-04-18 LAB — PHOSPHORUS: Phosphorus: 2.7 mg/dL (ref 2.5–4.9)

## 2012-04-19 LAB — CBC WITH DIFFERENTIAL/PLATELET
Bands: 4 %
HGB: 10 g/dL — ABNORMAL LOW (ref 12.0–16.0)
MCH: 30.7 pg (ref 26.0–34.0)
MCHC: 34.4 g/dL (ref 32.0–36.0)
MCV: 89 fL (ref 80–100)
Metamyelocyte: 2 %
Monocytes: 3 %
Platelet: 43 10*3/uL — ABNORMAL LOW (ref 150–440)
RBC: 3.25 10*6/uL — ABNORMAL LOW (ref 3.80–5.20)
Segmented Neutrophils: 83 %

## 2012-04-19 LAB — BASIC METABOLIC PANEL
Chloride: 107 mmol/L (ref 98–107)
Co2: 26 mmol/L (ref 21–32)
Creatinine: 0.51 mg/dL — ABNORMAL LOW (ref 0.60–1.30)
EGFR (Non-African Amer.): >60
Glucose: 124 mg/dL — ABNORMAL HIGH (ref 65–99)
Osmolality: 287 (ref 275–301)
Potassium: 3.9 mmol/L (ref 3.5–5.1)
Sodium: 141 mmol/L (ref 136–145)

## 2012-04-19 LAB — MAGNESIUM: Magnesium: 2.1 mg/dL

## 2012-04-20 LAB — CBC WITH DIFFERENTIAL/PLATELET
Bands: 2 %
HCT: 25.4 % — ABNORMAL LOW (ref 35.0–47.0)
MCH: 31.7 pg (ref 26.0–34.0)
MCHC: 35.3 g/dL (ref 32.0–36.0)
Metamyelocyte: 2 %
Monocytes: 2 %
NRBC/100 WBC: 25 /
Platelet: 12 10*3/uL — CL (ref 150–440)
RDW: 29 % — ABNORMAL HIGH (ref 11.5–14.5)
Segmented Neutrophils: 91 %
WBC: 8.3 10*3/uL (ref 3.6–11.0)

## 2012-04-20 LAB — MAGNESIUM: Magnesium: 2.3 mg/dL

## 2012-04-20 LAB — CALCIUM: Calcium, Total: 7.3 mg/dL — ABNORMAL LOW (ref 8.5–10.1)

## 2012-04-21 LAB — CBC WITH DIFFERENTIAL/PLATELET
Basophil #: 0 10*3/uL (ref 0.0–0.1)
Basophil %: 0.5 %
Eosinophil #: 0 10*3/uL (ref 0.0–0.7)
HCT: 21.4 % — ABNORMAL LOW (ref 35.0–47.0)
HGB: 7.7 g/dL — ABNORMAL LOW (ref 12.0–16.0)
Lymphocyte #: 0.1 10*3/uL — ABNORMAL LOW (ref 1.0–3.6)
Lymphocyte %: 1.7 %
MCHC: 35.9 g/dL (ref 32.0–36.0)
MCV: 91 fL (ref 80–100)
Neutrophil #: 6.1 10*3/uL (ref 1.4–6.5)
Neutrophil %: 94.5 %
RDW: 32.3 % — ABNORMAL HIGH (ref 11.5–14.5)

## 2012-04-22 LAB — CBC WITH DIFFERENTIAL/PLATELET
Eosinophil %: 0.1 %
HGB: 6.9 g/dL — ABNORMAL LOW (ref 12.0–16.0)
Lymphocyte #: 0.1 10*3/uL — ABNORMAL LOW (ref 1.0–3.6)
MCH: 32 pg (ref 26.0–34.0)
MCV: 91 fL (ref 80–100)
Monocyte #: 0.2 x10 3/mm (ref 0.2–0.9)
Monocyte %: 3 %
Neutrophil #: 5.5 10*3/uL (ref 1.4–6.5)
RBC: 2.15 10*6/uL — ABNORMAL LOW (ref 3.80–5.20)
RDW: 38.9 % — ABNORMAL HIGH (ref 11.5–14.5)

## 2012-04-22 LAB — POTASSIUM: Potassium: 4 mmol/L (ref 3.5–5.1)

## 2012-04-22 LAB — SODIUM: Sodium: 143 mmol/L (ref 136–145)

## 2012-04-23 LAB — CBC WITH DIFFERENTIAL/PLATELET
HGB: 7.5 g/dL — ABNORMAL LOW (ref 12.0–16.0)
MCHC: 35.4 g/dL (ref 32.0–36.0)
MCV: 93 fL (ref 80–100)
RDW: 28.7 % — ABNORMAL HIGH (ref 11.5–14.5)

## 2012-04-23 LAB — MAGNESIUM: Magnesium: 2.1 mg/dL

## 2012-04-23 LAB — PHOSPHORUS: Phosphorus: 2.6 mg/dL (ref 2.5–4.9)

## 2012-04-24 LAB — CBC WITH DIFFERENTIAL/PLATELET
Bands: 6 %
Basophil %: 0 %
Eosinophil #: 0 10*3/uL (ref 0.0–0.7)
Eosinophil %: 0 %
HCT: 20.5 % — ABNORMAL LOW (ref 35.0–47.0)
Lymphocyte #: 0.1 10*3/uL — ABNORMAL LOW (ref 1.0–3.6)
MCH: 32.4 pg (ref 26.0–34.0)
MCHC: 34.7 g/dL (ref 32.0–36.0)
MCV: 93 fL (ref 80–100)
Metamyelocyte: 1 %
Monocyte #: 0.2 x10 3/mm (ref 0.2–0.9)
Myelocyte: 1 %
Platelet: 9 10*3/uL — CL (ref 150–440)
RBC: 2.19 10*6/uL — ABNORMAL LOW (ref 3.80–5.20)
Segmented Neutrophils: 90 %

## 2012-04-24 LAB — SODIUM: Sodium: 141 mmol/L (ref 136–145)

## 2012-04-24 LAB — CALCIUM: Calcium, Total: 7.7 mg/dL — ABNORMAL LOW (ref 8.5–10.1)

## 2012-04-24 LAB — POTASSIUM: Potassium: 3.9 mmol/L (ref 3.5–5.1)

## 2012-04-24 LAB — ALBUMIN: Albumin: 2.7 g/dL — ABNORMAL LOW (ref 3.4–5.0)

## 2012-04-25 LAB — CBC WITH DIFFERENTIAL/PLATELET
HCT: 24.4 % — ABNORMAL LOW (ref 35.0–47.0)
HGB: 8.7 g/dL — ABNORMAL LOW (ref 12.0–16.0)
Lymphocytes: 9 %
MCV: 91 fL (ref 80–100)
Monocytes: 1 %
NRBC/100 WBC: 18 /
Platelet: 34 10*3/uL — ABNORMAL LOW (ref 150–440)
RBC: 2.69 10*6/uL — ABNORMAL LOW (ref 3.80–5.20)
WBC: 4.2 10*3/uL (ref 3.6–11.0)

## 2012-04-25 LAB — BASIC METABOLIC PANEL
Anion Gap: 9 (ref 7–16)
Calcium, Total: 7.4 mg/dL — ABNORMAL LOW (ref 8.5–10.1)
Co2: 23 mmol/L (ref 21–32)
EGFR (African American): >60
EGFR (Non-African Amer.): >60
Potassium: 4.1 mmol/L (ref 3.5–5.1)

## 2012-04-25 LAB — PROTIME-INR
INR: 1.2
Prothrombin Time: 15.9 secs — ABNORMAL HIGH (ref 11.5–14.7)

## 2012-04-25 LAB — PHOSPHORUS: Phosphorus: 2.8 mg/dL (ref 2.5–4.9)

## 2012-04-25 LAB — HEPATIC FUNCTION PANEL A (ARMC)
Albumin: 2.4 g/dL — ABNORMAL LOW (ref 3.4–5.0)
Alkaline Phosphatase: 706 U/L — ABNORMAL HIGH (ref 50–136)
Bilirubin, Direct: 0.4 mg/dL — ABNORMAL HIGH (ref 0.00–0.20)
Bilirubin,Total: 1.4 mg/dL — ABNORMAL HIGH (ref 0.2–1.0)
SGOT(AST): 39 U/L — ABNORMAL HIGH (ref 15–37)
SGPT (ALT): 24 U/L (ref 12–78)

## 2012-04-26 LAB — LIPASE, BLOOD: Lipase: 78 U/L (ref 73–393)

## 2012-04-26 LAB — CBC WITH DIFFERENTIAL/PLATELET
Bands: 6 %
Basophil #: 0 10*3/uL (ref 0.0–0.1)
Eosinophil #: 0 10*3/uL (ref 0.0–0.7)
HCT: 20.7 % — ABNORMAL LOW (ref 35.0–47.0)
HGB: 7.3 g/dL — ABNORMAL LOW (ref 12.0–16.0)
Lymphocyte #: 0.2 10*3/uL — ABNORMAL LOW (ref 1.0–3.6)
MCHC: 35.3 g/dL (ref 32.0–36.0)
MCV: 92 fL (ref 80–100)
Monocyte %: 2.5 %
Monocytes: 1 %
Neutrophil #: 2.9 10*3/uL (ref 1.4–6.5)
Neutrophil %: 90.3 %
Platelet: 5 10*3/uL — CL (ref 150–440)
RDW: 22.3 % — ABNORMAL HIGH (ref 11.5–14.5)
WBC: 3.2 10*3/uL — ABNORMAL LOW (ref 3.6–11.0)

## 2012-04-26 LAB — BASIC METABOLIC PANEL
Calcium, Total: 7.2 mg/dL — ABNORMAL LOW (ref 8.5–10.1)
Chloride: 104 mmol/L (ref 98–107)
Co2: 22 mmol/L (ref 21–32)
EGFR (African American): >60
Glucose: 136 mg/dL — ABNORMAL HIGH (ref 65–99)
Osmolality: 279 (ref 275–301)
Potassium: 4 mmol/L (ref 3.5–5.1)

## 2012-05-15 ENCOUNTER — Ambulatory Visit: Payer: Self-pay | Admitting: Radiation Oncology

## 2012-05-15 DEATH — deceased

## 2013-01-08 IMAGING — CT CT OUTSIDE FILMS BODY
1 series · 16 of 32 positions shown, 20 images · non-contrast
Comparison: none

[Series 2: soft tissue (id) x (id) · axial · 0.98mm/px · z∈[-94,+296]mm · 16 of 174 slices shown, 20 images]
[im 12/174  soft-tissue]
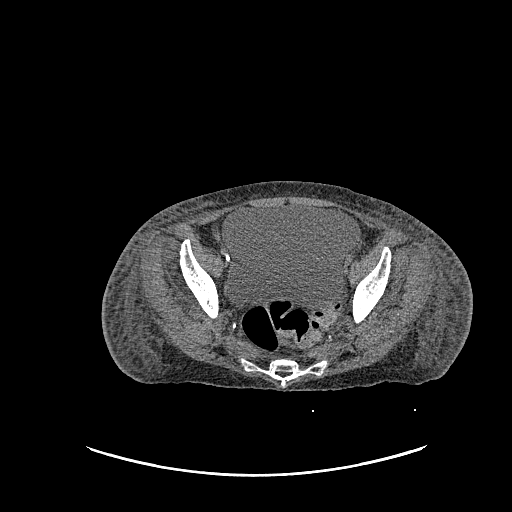
[im 12/174  bone]
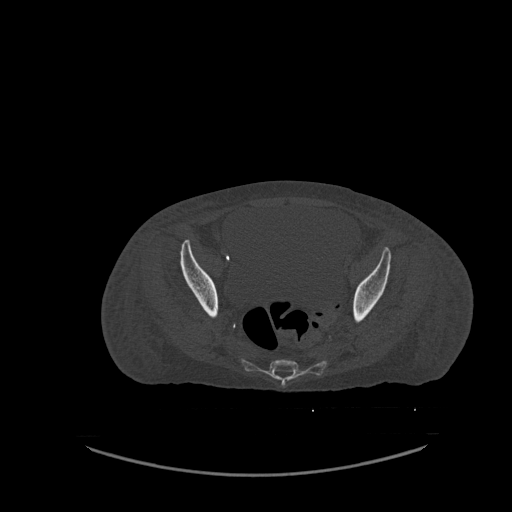
[im 23/174  soft-tissue]
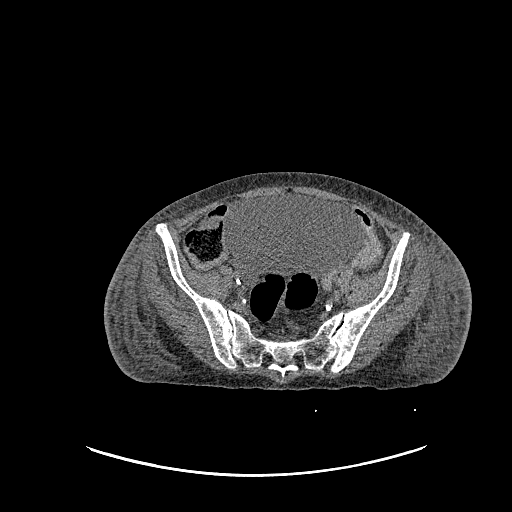
[im 34/174  soft-tissue]
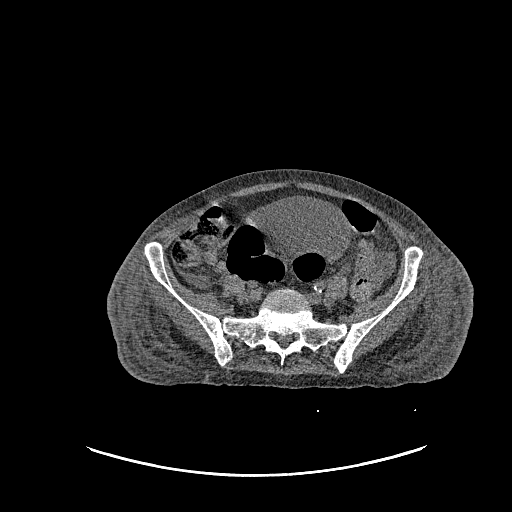
[im 45/174  soft-tissue]
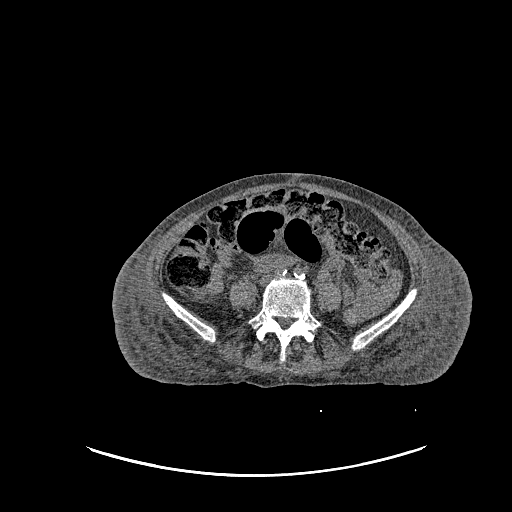
[im 56/174  soft-tissue]
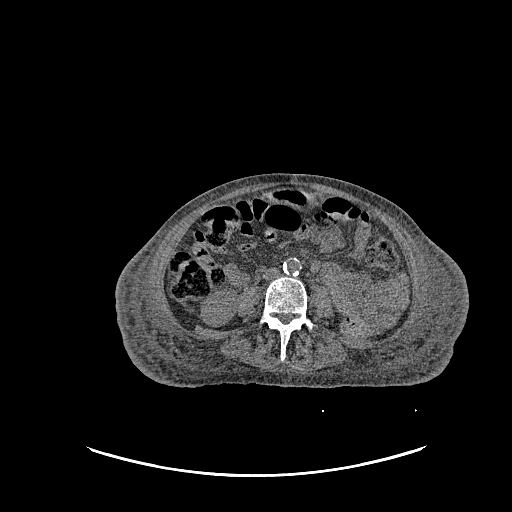
[im 67/174  soft-tissue]
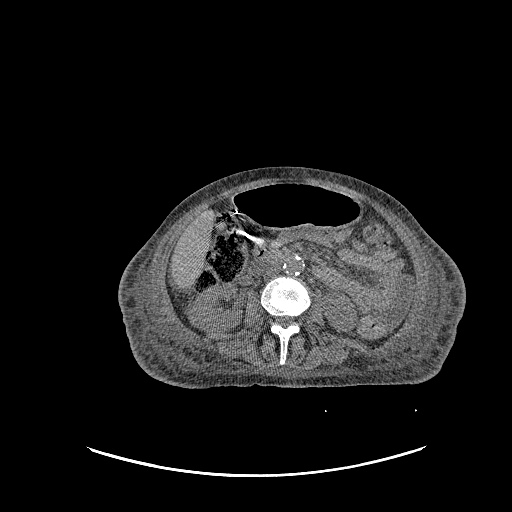
[im 79/174  soft-tissue]
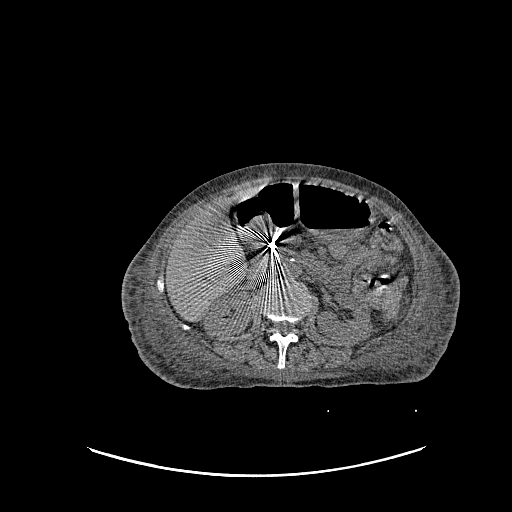
[im 95/174  soft-tissue]
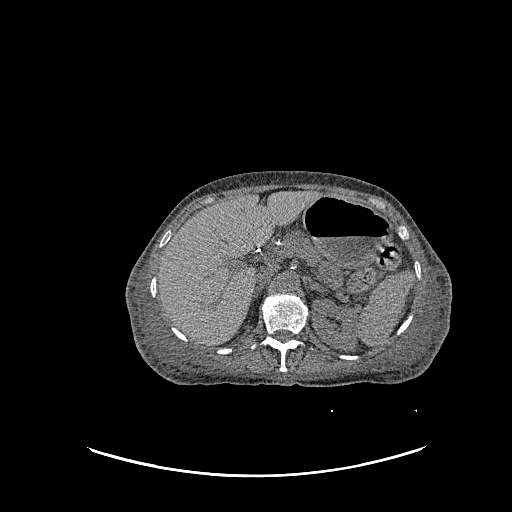
[im 107/174  soft-tissue]
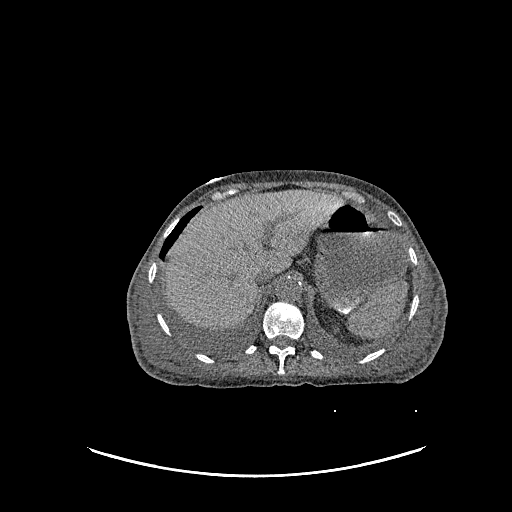
[im 107/174  bone]
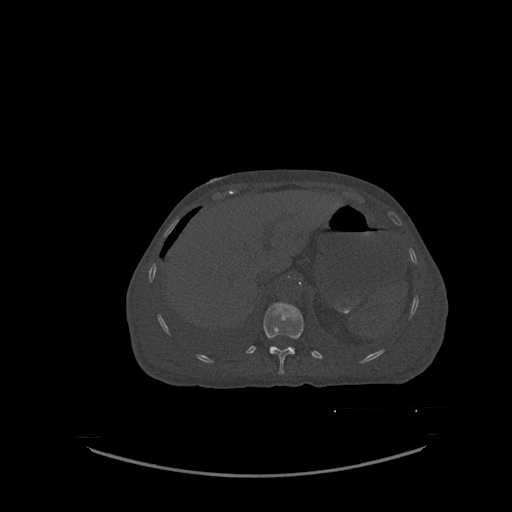
[im 118/174  soft-tissue]
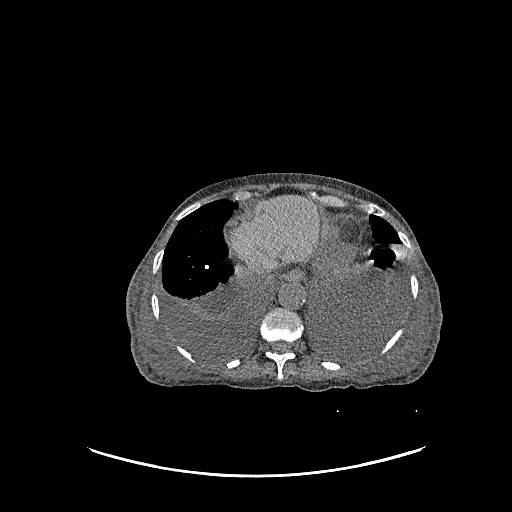
[im 129/174  soft-tissue]
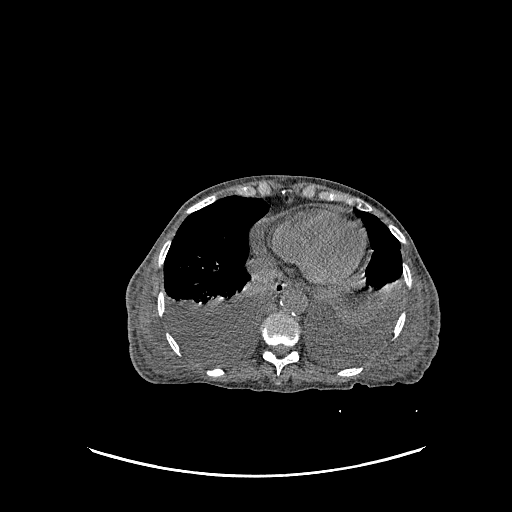
[im 140/174  soft-tissue]
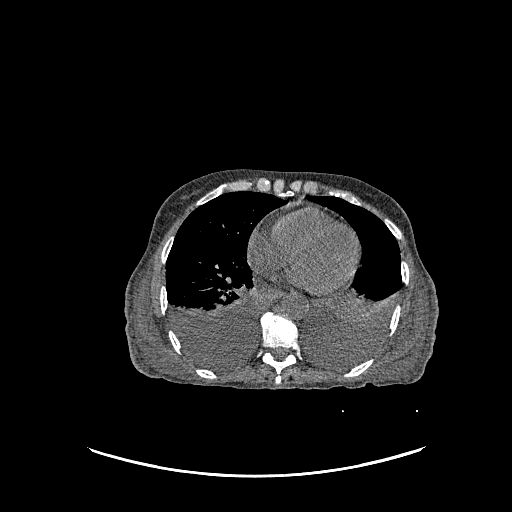
[im 151/174  soft-tissue]
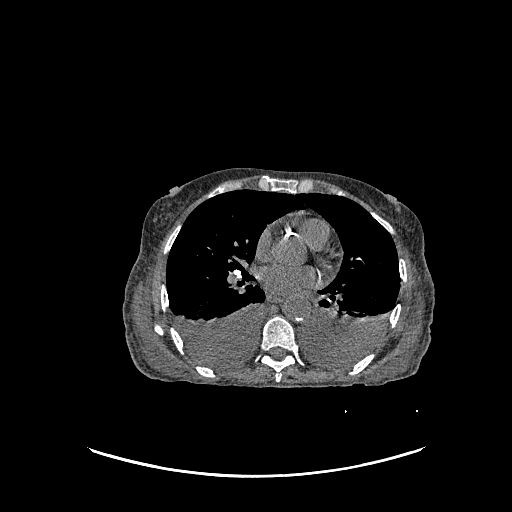
[im 151/174  lung]
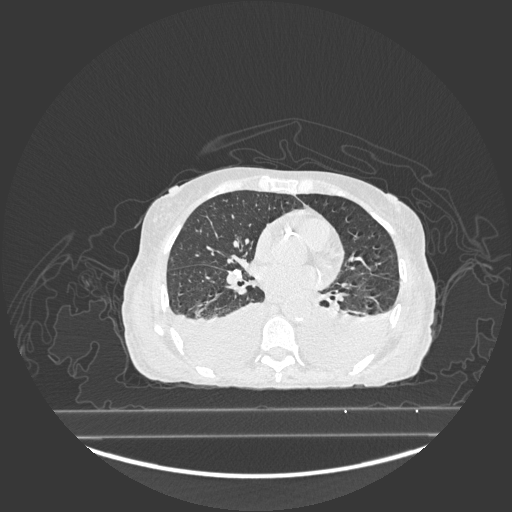
[im 157/174  lung]
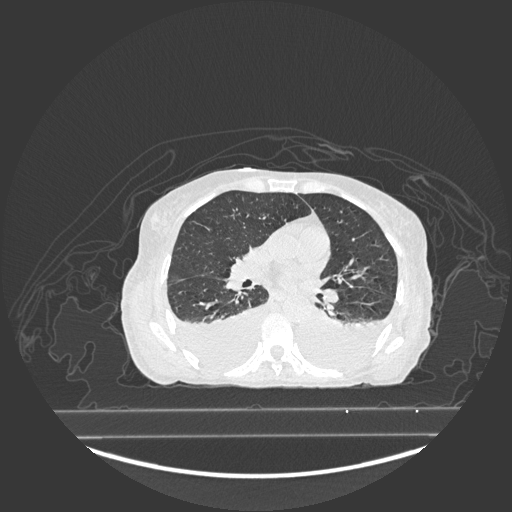
[im 162/174  soft-tissue]
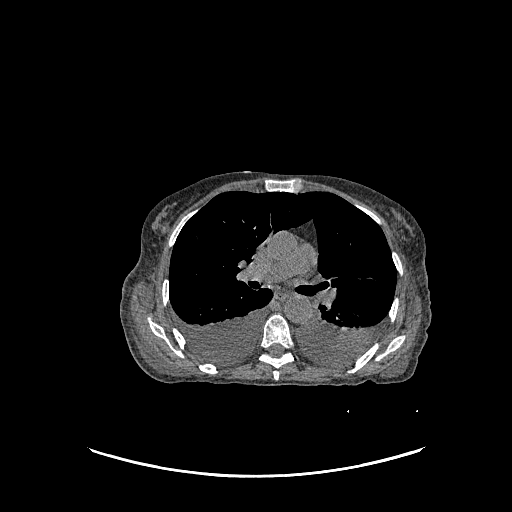
[im 162/174  lung]
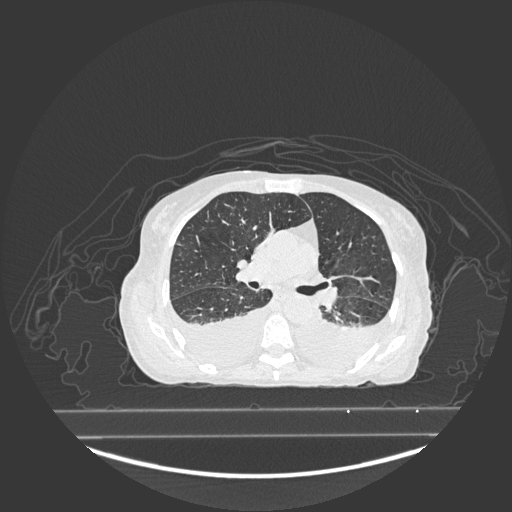
[im 168/174  lung]
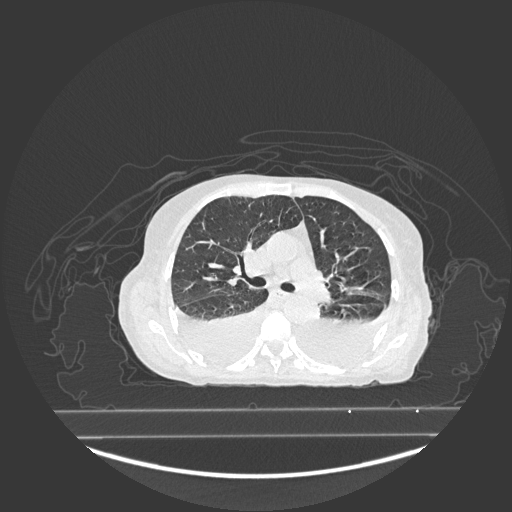

[16 of 32 positions shown; findings below may reference images not displayed]

**** An original report or order could not be provided from the [HOSPITAL] [facility] ****

## 2013-01-22 IMAGING — XA IR VASCULAR PROCEDURE
1 series · 1 of 1 positions shown · non-contrast
Comparison: none

[Series 1: single · 1 of 1 slices shown]
[im 1/1]
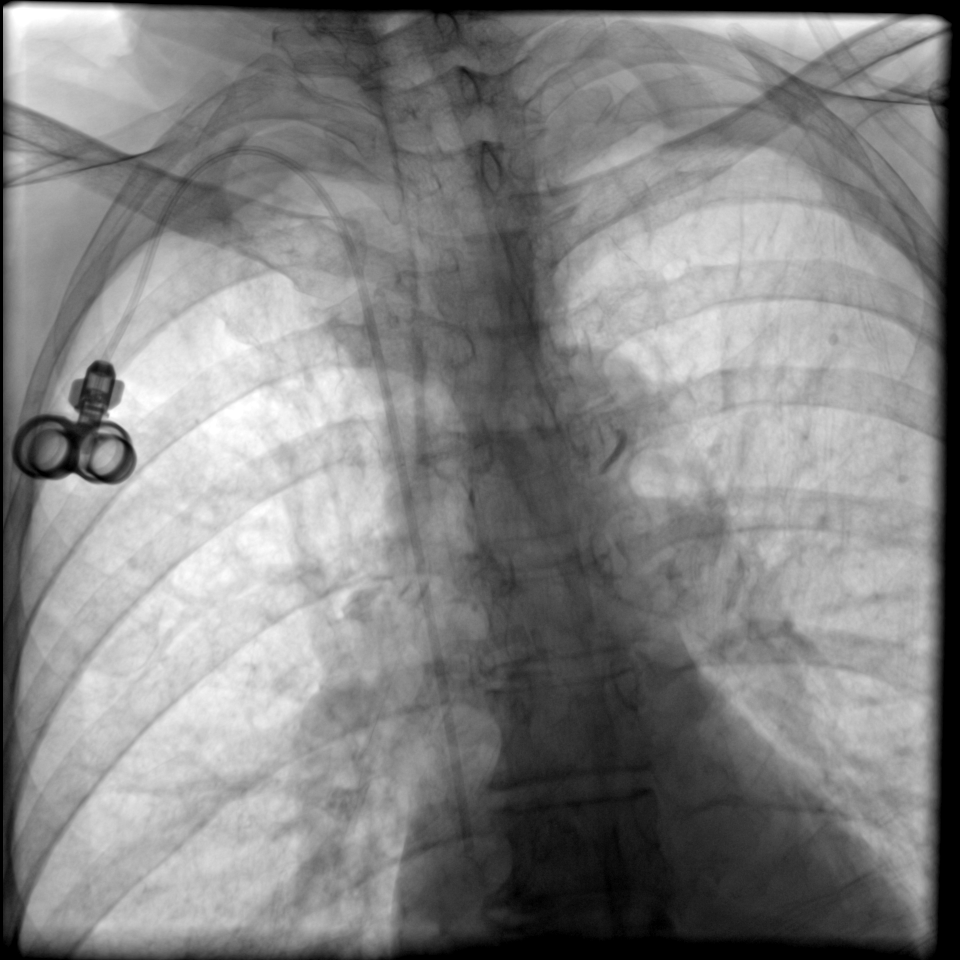

[1 of 1 positions shown; findings below may reference images not displayed]

IMAGES IMPORTED FROM THE [employer] WORKFLOW SYSTEM
NO DICTATION FOR STUDY

## 2013-01-25 IMAGING — CR DG ABDOMEN 3V
1 series · 3 of 3 positions shown · non-contrast
Comparison: none

REASON FOR EXAM: new epigastric pain
COMMENTS:

[Series 1: x abdomen erect · 0.14mm/px · 3 of 3 slices shown]
[im 1/3]
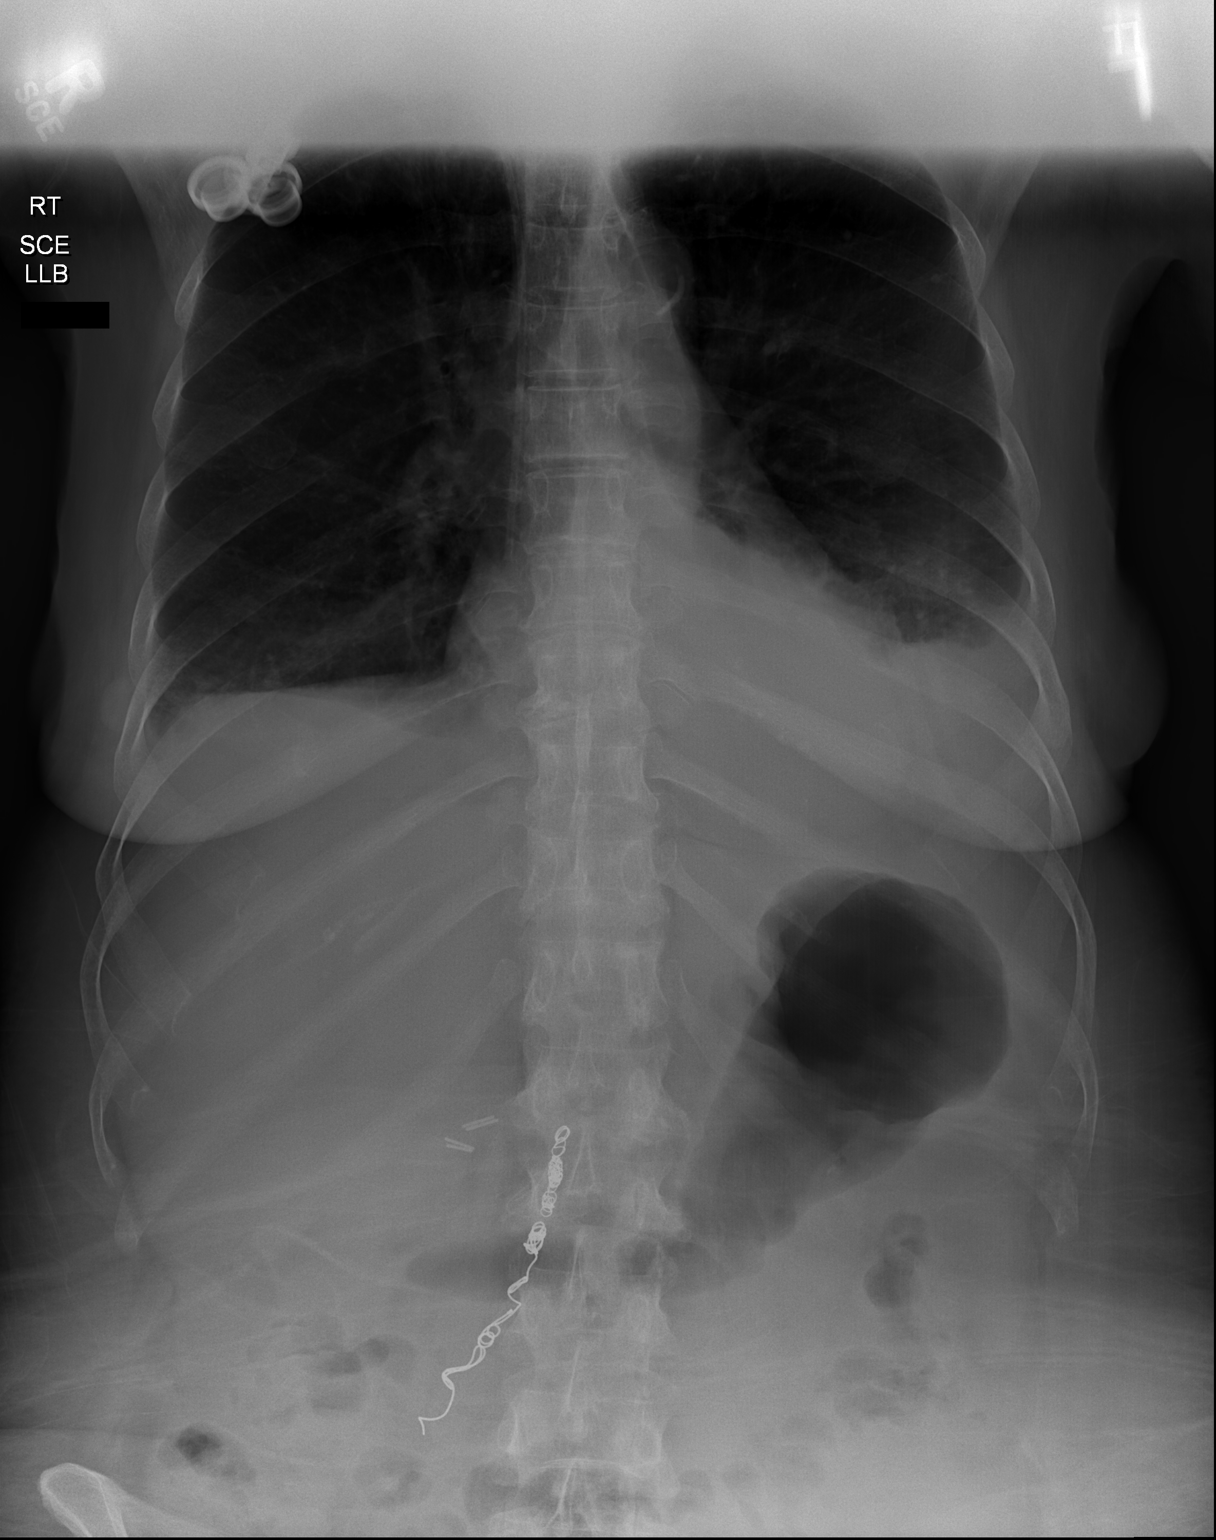
[im 2/3]
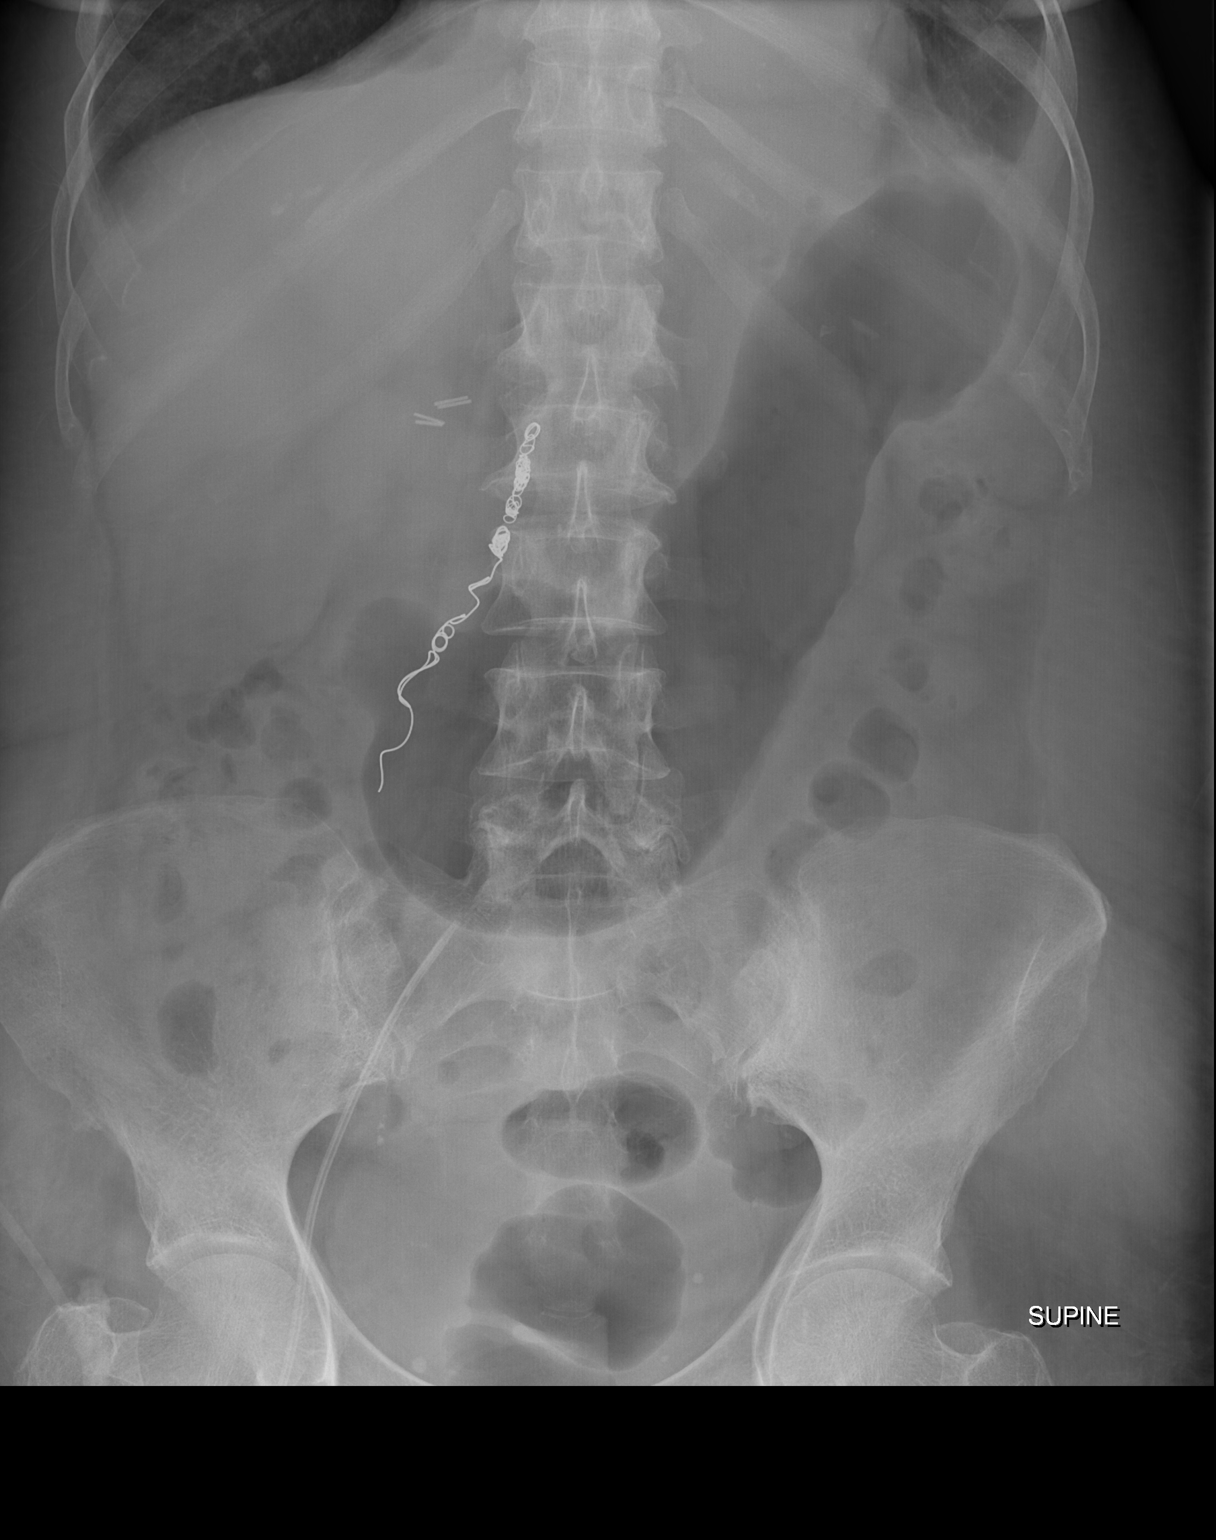
[im 3/3]
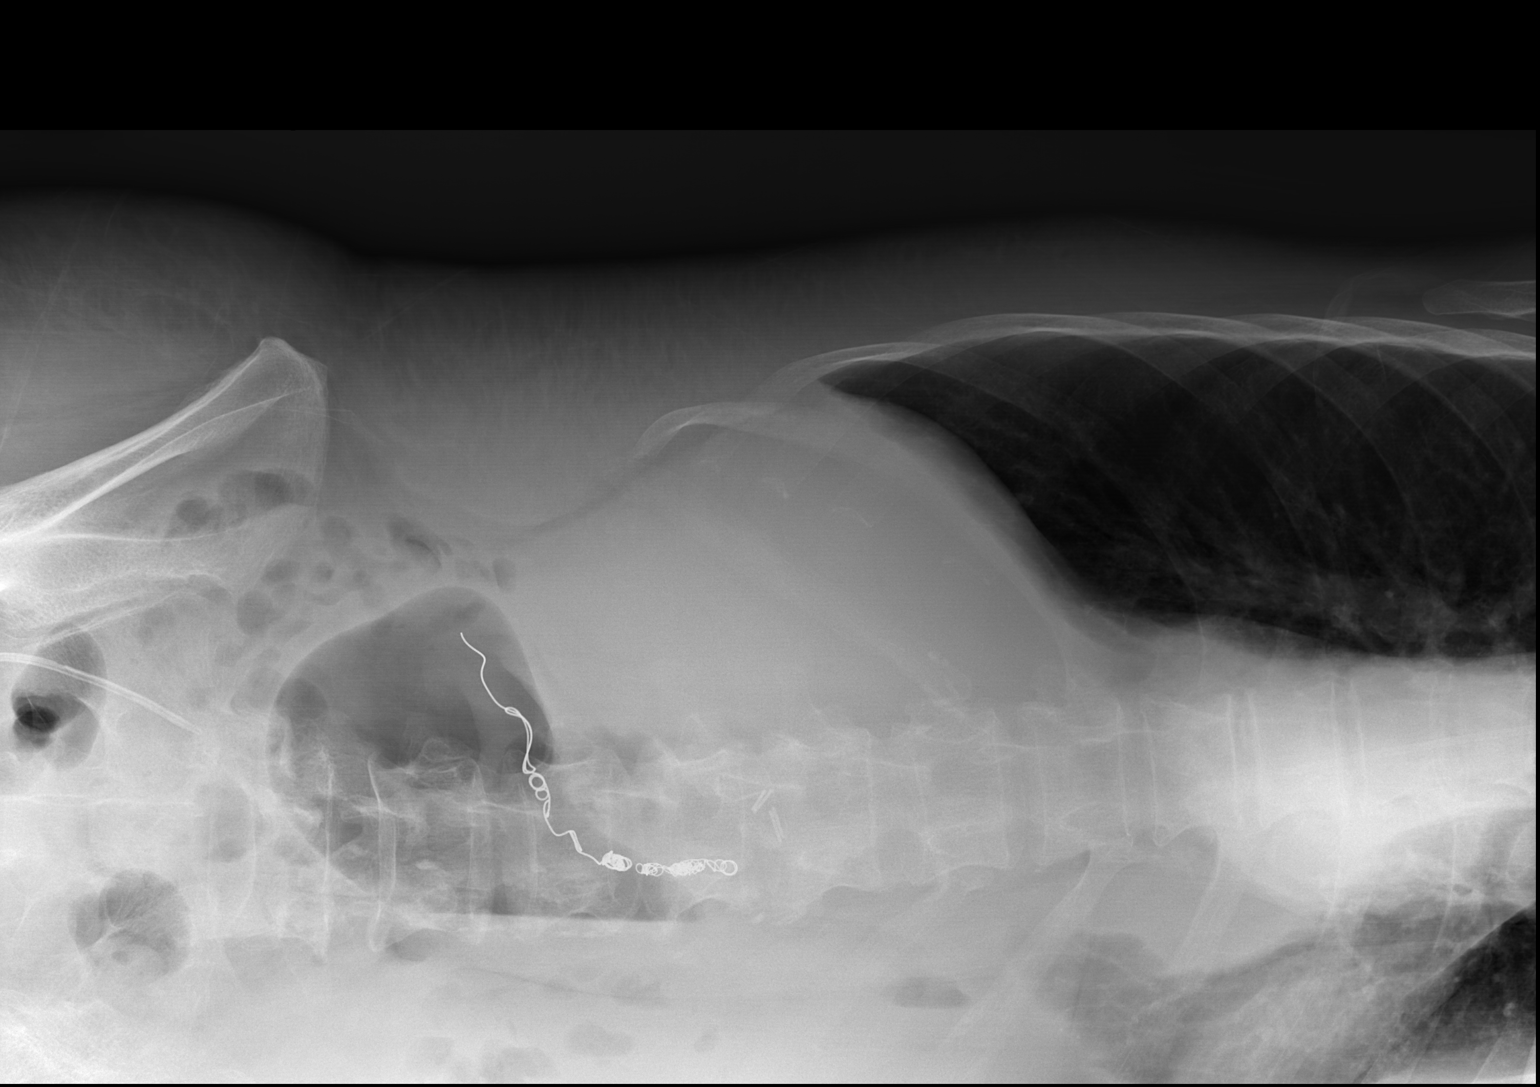

[3 of 3 positions shown; findings below may reference images not displayed]

PROCEDURE:     DXR - DXR ABDOMEN COMPLETE  - April 25, 2012  [DATE]

RESULT:     Comparison is made to the study March 28, 2012.

There is mild gaseous distention of the stomach. There is a metallic
wire-like structure which may be external to the patient which lies just to
the right of the mid lumbar spine. There are surgical clips in the
gallbladder fossa. There is a structure consistent with a femoral catheter
on the right. The bowel gas pattern does not suggest obstruction or ileus or
perforation. There is increased density at the left lung base consistent
with fluid and perhaps atelectasis or infiltrate.
IMPRESSION: 1. The bowel gas pattern does not suggest obstruction or ileus.
2. There is mild distention of the stomach with gas and some fluid.
3. An unspecified wire-like structure is present to the right in the lumbar
spine.
4. There is fluid and likely atelectasis or pneumonia at the left lung base.

[REDACTED]

## 2014-10-02 NOTE — Consult Note (Signed)
Chief Complaint:   Subjective/Chief Complaint c/o epigastric pain today, no emesis, mild nausea.  no evidence of continure bloody bm, however with some epistaxis.   VITAL SIGNS/ANCILLARY NOTES: **Vital Signs.:   11-Nov-13 06:45   Vital Signs Type Blood Transfusion Complete   Temperature Temperature (F) 98   Celsius 36.6   Temperature Source oral   Pulse Pulse 79   Respirations Respirations 20   Systolic BP Systolic BP 914   Diastolic BP (mmHg) Diastolic BP (mmHg) 79   Mean BP 109   Pulse Ox % Pulse Ox % 97   Oxygen Delivery Room Air/ 21 %   Brief Assessment:   Cardiac Regular    Respiratory clear BS    Gastrointestinal details normal Soft  Nondistended  Bowel sounds normal  No rebound tenderness  No gaurding  No rigidity  marked tenderness in the epigastrum.   Lab Results: Routine Chem:  11-Nov-13 06:51    Magnesium, Serum 2.0 (1.8-2.4 THERAPEUTIC RANGE: 4-7 mg/dL TOXIC: > 10 mg/dL  -----------------------)   Phosphorus, Serum 2.8 (Result(s) reported on 25 Apr 2012 at 08:38AM.)   Result Comment LABS - This specimen was collected through an   - indwelling catheter or arterial line.  - A minimum of 83ms of blood was wasted prior    - to collecting the sample.  Interpret     - results with caution. Platelet - RESULTS VERIFIED BY REPEAT TESTING.  - VERIFIED BY SMEAR ESTIMATE WBC - RESULT CORRECTED FOR NUCLEATED RBCS  Result(s) reported on 25 Apr 2012 at 09:23AM.   Glucose, Serum  147   BUN  26   Creatinine (comp)  0.44   Sodium, Serum 137   Potassium, Serum 4.1   Chloride, Serum 105   CO2, Serum 23   Calcium (Total), Serum  7.4   Anion Gap 9   Osmolality (calc) 281   eGFR (African American) >60   eGFR (Non-African American) >60 (eGFR values <690mmin/1.73 m2 may be an indication of chronic kidney disease (CKD). Calculated eGFR is useful in patients with stable renal function. The eGFR calculation will not be reliable in acutely ill patients when serum creatinine  is changing rapidly. It is not useful in  patients on dialysis. The eGFR calculation may not be applicable to patients at the low and high extremes of body sizes, pregnant women, and vegetarians.)  Routine Hem:  11-Nov-13 06:51    WBC (CBC) 4.2   RBC (CBC)  2.69   Hemoglobin (CBC)  8.7   Hematocrit (CBC)  24.4   Platelet Count (CBC)  34   MCV 91   MCH 32.2   MCHC 35.5   RDW  16.9   Bands 5   Segmented Neutrophils 85   Lymphocytes 9   Monocytes 1   NRBC 18   Diff Comment 1 PLTS VARIED IN SIZE   Diff Comment 2 ANISOCYTOSIS   Diff Comment 3 POIKILOCYTOSIS   Diff Comment 4 POLYCHROMASIA   Diff Comment 5 SCHISTOCYTES  Result(s) reported on 25 Apr 2012 at 09:23AM.   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  no apparent ongoing GI bleed at this point. s/p microembilization and radiation treatment.   However, patient with new epigastric pain.   2) microangiopathic anemia, thronbocytopenia in setting of metastatic adenoca.    Plan 1) will check lipase,lfts and 3 way abdominal film.   Electronic Signatures: SkLoistine SimasMD)  (Signed [id number])  Authored: Chief  Complaint, VITAL SIGNS/ANCILLARY NOTES, Brief Assessment, Lab Results, Assessment/Plan   Last Updated: 11-Nov-13 13:21 by Katie Gilmore (MD)

## 2014-10-02 NOTE — Op Note (Signed)
PATIENT NAME:  Katie Gilmore, Katie Gilmore MR#:  [id number] DATE OF BIRTH:  1945/11/13  DATE OF PROCEDURE:  04/22/2012  PREOPERATIVE DIAGNOSES:  1. Metastatic adenocarcinoma.  2. Gastrointestinal bleed.  3. Lack of appropriate IV access.   POSTOPERATIVE DIAGNOSES:  1. Metastatic adenocarcinoma.  2. Gastrointestinal bleed.  3. Lack of appropriate IV access.   PROCEDURE PERFORMED: Insertion of right IJ double lumen Infuse-a-Port.   SURGEON: Levora DredgeGregory Tong Pieczynski, MD  ANESTHESIA: Versed 3 mg plus fentanyl 100 mcg administered IV. Continuous ECG, pulse oximetry, and cardiopulmonary monitoring is performed throughout the entire procedure by the interventional radiology nurse. Total sedation time was 45 minutes.   ACCESS: Right IJ.   CONTRAST USED: None.   FLUORO TIME: 0.4 minutes.   INDICATIONS: [name] is a 69 year old woman with multiple medical problems who now requires multiple parenteral medications including possible chemotherapy. The risks and benefits for Infuse-a-Port placement were reviewed with the patient as well as with Dr. Lorre NickGittin, her oncologist. Based on the need, she will require a double lumen port and this will be placed. The patient agrees.   DESCRIPTION OF PROCEDURE: The patient is taken to special procedures and placed in the supine position. After adequate sedation is achieved, the right neck and chest wall are prepped and draped in a sterile fashion. Collier Flowersoban is then placed to exclude contamination from the skin. 1% lidocaine is infiltrated at the base of the neck after ultrasound is used to evaluate the jugular vein. It is also infused in the chest wall two fingerbreadths below the clavicle.   Ultrasound, which has been placed in a sterile sleeve, is then utilized for access. The jugular vein is identified, is compressible indicating patency, image is recorded, and under real-time visualization Seldinger needle is inserted into the jugular vein and J-wire is advanced under  fluoroscopic guidance. Counterincision is made with an 11 blade scalpel and subsequently dilator and peel-away sheath are advanced.   A small pocket is then created on the chest wall after incision with an 11 blade scalpel and both blunt and sharp dissection is used with Metzenbaum scissors. The port pocket is tested for appropriate size. Subsequently the catheter is pulled subcutaneously from the pocket to the neck counterincision. It is then advanced through the peel-away sheath, which is removed. Under fluoroscopy, the catheter is positioned with its tip at the atriocaval junction. It is then transected, the hub is connected, and then slipped into the pocket without difficulty. Both lumens are then aspirated and flushed easily with heparinized saline. The port pocket incision is closed with interrupted 3-0 Vicryl followed by 4-0 Monocryl subcuticular and Dermabond. The neck counterincision is closed with 4-0 Monocryl subcuticular and Dermabond. The patient tolerated the procedure well and there were no immediate complications. Sponge and needle counts were correct. She was taken to the recovery area in excellent condition.  ____________________________ Renford DillsGregory G. Jeziah Kretschmer, MD ggs:slb D: 04/25/2012 14:59:00 ET T: 04/25/2012 15:26:27 ET JOB#: [id number]  cc: Knute Neuobert G. Lorre NickGittin, MD Dennison MascotLemont Morrisey, MD Renford DillsGregory G. Tywanda Rice, MD, <Dictator>  Renford DillsGREGORY G Brooksie Ellwanger MD ELECTRONICALLY SIGNED 04/26/2012 12:14

## 2014-10-02 NOTE — Discharge Summary (Signed)
PATIENT NAME:  Katie Gilmore, Katie Gilmore MR#:  [id number] DATE OF BIRTH:  28-Oct-1945  DATE OF ADMISSION:  03/21/2012 DATE OF DISCHARGE:  04/26/2012  ADMITTING DIAGNOSIS: GI bleed as well as acute anemia.   DISCHARGE DIAGNOSES:  1. Upper GI bleed from duodenal ulcer, most likely metastatic focus requiring multiple units of packed RBCs.  2. Acute blood loss anemia, combination of acute blood loss and hemolytic anemia.  3. Thrombocytopenia requiring multiple transfusions.  4. H. Pylori infection status post treatment.  5. Hypotension.  6. Metastatic carcinoma, likely GI in nature, status post bone marrow biopsy showing metastatic carcinoma status post radiation treatment for her GI bleed as well as EGD.  7. Pulmonary nodules.  8. Back pain.  9. Seizure disorder.   CONSULTANTS:  1. Dr. Gustavo Lah  2. Dr. Inez Pilgrim  3. Dr. Burt Knack  4. Dr. Dionne Milo   Please refer to multiple interim discharge summaries done from 11/02 to 11/08 dictated by Dr. Verdell Carmine, by Dr. Ether Griffins on 04/08/2012, and Dr. Laurin Coder on 03/30/2012.   HOSPITAL COURSE: Please refer to history and physical for details. The patient presented with generalized weakness, was noted to have an upper GI bleed, anemia. Her upper GI bleed was from a duodenal ulcer from a metastatic focus. The patient is status post vascular surgery for embolization, had a large duodenal ulcer on EGD, and was getting radiation therapy daily and being transfused packed red blood cells on a daily basis. The patient had for a short period hematochezia that had stopped, however, it has recurred since yesterday with the patient having hematemesis and dark-colored stools. The patient also was noted to have hemolytic anemia. She was placed on high dose steroids and again was transfused but once she was transfused quickly her blood count would drop. The patient also had severe thrombocytopenia which was felt to be bone marrow infiltration by the adenocarcinoma. The patient was  transfused on a daily basis with thrombocytopenia. The patient also was diagnosed with metastatic carcinoma of likely GI origin. She had radiation therapy daily per Dr. Inez Pilgrim of Hematology. She also had a port placed. She also had a positive bone scan as well as pulmonary nodules consistent with metastatic cancer. The patient was continued to be treated aggressively. However, she continued requiring blood products. Since yesterday she started having unstoppable nosebleed as well as again started having some hematemesis and hematochezia. Her blood count dropped again very low as well as platelet count. I spoke to her daughter and updated her on the condition. Dr. Ermalinda Memos of Palliative Care also spoke to the daughter and it was decided to make her COMFORT CARE with Hospice. At this time she is being arranged to go to Hospice home.   DISCHARGE MEDICATIONS:  1. Roxanol 20 mg/mL 0.5 to 1 mL p.o. or sublingually 1 to 2 hours p.r.n. for pain.  2. OxyFAST 20 mg/mL q.1 to 2 hours p.r.n. pain. 3. Lorazepam 0.5 to 1 mg p.o. sublingual every 2 to 4 hours p.r.n.  4. Ranitidine 150 p.o. b.i.d.   DISCHARGE ACTIVITY: As tolerated.   DISCHARGE DIET: As tolerated.   TIME SPENT: 35 minutes.   ____________________________ Lafonda Mosses Posey Pronto, MD shp:drc D: 04/27/2012 09:21:29 ET T: 04/27/2012 12:14:24 ET JOB#: [id number]  cc: Malai Lady H. Posey Pronto, MD, <Dictator> Alric Seton MD ELECTRONICALLY SIGNED 05/02/2012 10:22

## 2014-10-02 NOTE — Consult Note (Signed)
Chief Complaint:   Subjective/Chief Complaint mild nausea after radiation tx today.  denies abdominal pain. no repoet of emesis today.  tolerating full liquids.   VITAL SIGNS/ANCILLARY NOTES: **Vital Signs.:   01-Nov-13 17:32   Vital Signs Type Q 4hr   Temperature Temperature (F) 97.6   Celsius 36.4   Temperature Source axillary   Pulse Pulse 82   Respirations Respirations 18   Systolic BP Systolic BP 141   Diastolic BP (mmHg) Diastolic BP (mmHg) 71   Mean BP 94   Pulse Ox % Pulse Ox % 97   Pulse Ox Activity Level  At rest   Oxygen Delivery Room Air/ 21 %   Brief Assessment:   Cardiac Regular    Respiratory clear BS    Gastrointestinal details normal Soft  Nontender  Nondistended  No masses palpable  Bowel sounds normal   Lab Results: Routine BB:  01-Nov-13 09:05    Platelets (Blood Component) Issued (Result(s) reported on 15 Apr 2012 at 03:22PM.)  Routine Chem:  01-Nov-13 04:51    Result Comment PLATELET - RESULTS VERIFIED BY REPEAT TESTING.  - NOTIFIED OF CRITICAL VALUE  - C/KAREN LESTER AT [id number] 04/15/12.PM[facility]  - READ-BACK PROCESS PERFORMED. WBC - CORRECTED FOR NRBCS DIFFERENTIAL - SLIDE PREVIOUSLY REVIEWED BY PATHOLOGIST  Result(s) reported on 15 Apr 2012 at 06:41AM.   Magnesium, Serum 2.4 (1.8-2.4 THERAPEUTIC RANGE: 4-7 mg/dL TOXIC: > 10 mg/dL  -----------------------)   Sodium, Serum 143 (Result(s) reported [facility] Apr [facility]:21AM.)   Calcium (Total), Serum  7.3 (Result(s) reported on 15 Apr 2012 at [facility]:21AM.)   Phosphorus, Serum 2.6 (Result(s) reported on 15 Apr 2012 at [facility]06:21AM.)   Potassium, Serum 4.4 (Result(s) reported on 15 Apr 2012 at [facility]06:21AM.)  Routine Hem:  01-Nov-13 04:51    WBC (CBC) 9.1   RBC (CBC)  2.60   Hemoglobin (CBC)  8.2   Hematocrit (CBC)  24.0   Platelet Count (CBC)  20   MCV 93   MCH 31.6   MCHC 34.2   RDW  18.1   Bands 8   Segmented Neutrophils 74   Lymphocytes 9   Monocytes 2   Metamyelocyte 6   Myelocyte 1   NRBC 65   Diff  Comment 1 ANISOCYTOSIS   Diff Comment 2 POIKILOCYTOSIS   Diff Comment 3 POLYCHROMASIA   Diff Comment 4 SCHISTOCYTES   Diff Comment 5 PLTS VARIED IN SIZE  Result(s) reported on 15 Apr 2012 at 06:41AM.   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  S/P vascular interventional proveedure to the bleeding ulcer.  Currently undergoing radiation treatment to the DU site. Multiple plt and prbc tfx due to severe thrombocytopenia and probable hemolytic anemia.    Plan 1) no new Gi recs.  Dr Mechele CollinElliott covering this weekend if needed.   Electronic Signatures: Barnetta ChapelSkulskie, Nahzir Pohle (MD)  (Signed [phone]-Nov-13 18:09)  Authored: Chief Complaint, VITAL SIGNS/ANCILLARY NOTES, Brief Assessment, Lab Results, Assessment/Plan   Last Updated: 01-Nov-13 18:09 by Barnetta ChapelSkulskie, Kiely Cousar (MD)

## 2014-10-02 NOTE — Consult Note (Signed)
Chief Complaint:   Subjective/Chief Complaint I stopped by patients room to d/w family, none present, but patients minister was there.  At patient request I again explained findings and treatment.   Electronic Signatures: Barnetta ChapelSkulskie, Martin (MD)  (Signed 09-Oct-13 14:01)  Authored: Chief Complaint   Last Updated: 09-Oct-13 14:01 by Barnetta ChapelSkulskie, Martin (MD)

## 2014-10-02 NOTE — Consult Note (Signed)
Chief Complaint:   Subjective/Chief Complaint Events over the day noted.  Patient seen briefly in specials recovery.  no apparent distress, tolerated proceedure well.  Will order recheck of hgb for early this am.  Once labs have stabilized it may be possible to consider EGD to reassess ulcer sight and possibly biopsy as clinically feasible.   VITAL SIGNS/ANCILLARY NOTES: **Vital Signs.:   22-Oct-13 19:26   Vital Signs Type Post-Procedure; Pt back to the floor.   Temperature Temperature (F) 97.9   Celsius 36.6   Temperature Source oral   Pulse Pulse 96   Respirations Respirations 18   Systolic BP Systolic BP 148   Diastolic BP (mmHg) Diastolic BP (mmHg) 78   Mean BP 101   Pulse Ox % Pulse Ox % 96   Pulse Ox Activity Level  At rest   Oxygen Delivery Room Air/ 21 %   Lab Results: Routine Chem:  22-Oct-13 19:03    Result Comment NRBCS - SEEN ON SMEAR  Result(s) reported on 05 Apr 2012 at 07:20PM.  Routine Hem:  22-Oct-13 19:03    Hemoglobin (CBC)  9.1   Platelet Count (CBC)  68 (Result(s) reported on 05 Apr 2012 at 07:20PM.)   Electronic Signatures: Barnetta ChapelSkulskie, Chazz Philson (MD)  (Signed 22-Oct-13 22:03)  Authored: Chief Complaint, VITAL SIGNS/ANCILLARY NOTES, Lab Results   Last Updated: 22-Oct-13 22:03 by Barnetta ChapelSkulskie, Anjelina Dung (MD)

## 2014-10-02 NOTE — Consult Note (Signed)
Chief Complaint:   Subjective/Chief Complaint denies abdominal pain.  mild nausea no vomiting after XRT.   tolerating some full liquids items.   VITAL SIGNS/ANCILLARY NOTES: **Vital Signs.:   04-Nov-13 18:31   Vital Signs Type Blood Transfusion Complete   Temperature Temperature (F) 98.2   Celsius 36.7   Temperature Source Oral   Pulse Pulse 78   Respirations Respirations 18   Systolic BP Systolic BP 172   Diastolic BP (mmHg) Diastolic BP (mmHg) 72   Mean BP 105   Pulse Ox % Pulse Ox % 95   Oxygen Delivery Room Air/ 21 %  *Intake and Output.:   04-Nov-13 01:15   Stool  small black stool    17:30   Stool  med loose dark brown stool, no signs of blood   Brief Assessment:   Cardiac Regular    Respiratory clear BS    Gastrointestinal details normal Soft  Nontender  Nondistended  No masses palpable  Bowel sounds normal   Lab Results: Routine BB:  04-Nov-13 12:00    Platelets (Blood Component) Issued (Result(s) reported on 18 Apr 2012 at 04:52PM.)  Routine Chem:  04-Nov-13 04:29    Sodium, Serum 142 (Result(s) reported on 18 Apr 2012 at 06:49AM.)   Potassium, Serum 4.2 (Result(s) reported on 18 Apr 2012 at 05:18AM.)   Phosphorus, Serum 2.7 (Result(s) reported on 18 Apr 2012 at 05:18AM.)   Magnesium, Serum 2.3 (1.8-2.4 THERAPEUTIC RANGE: 4-7 mg/dL TOXIC: > 10 mg/dL  -----------------------)   Calcium (Total), Serum  7.1 (Result(s) reported on 18 Apr 2012 at 05:18AM.)   Result Comment wbc - RESULT CORRECTED FOR NUCLEATED RBCS cbc - SLIDE PREVIOUSLY REVIEWED BY PATHOLOGIST plt - VERIFIED BY SMEAR ESTIMATE  Result(s) reported on 18 Apr 2012 at 07:26AM.  Routine Hem:  04-Nov-13 04:29    WBC (CBC) 9.0   RBC (CBC)  2.40   Hemoglobin (CBC)  7.5   Hematocrit (CBC)  22.3   Platelet Count (CBC)  44   MCV 93   MCH 31.1   MCHC 33.4   RDW  26.2   Bands 4   Segmented Neutrophils 82   Lymphocytes 7   Monocytes 3   Metamyelocyte 3   Myelocyte 1   NRBC 36   Diff Comment 1  ANISOCYTOSIS   Diff Comment 2 POLYCHROMASIA   Diff Comment 3 POIKILOCYTOSIS   Diff Comment 4 SCHISTOCYTES  Result(s) reported on 18 Apr 2012 at 07:26AM.   Assessment/Plan:  Assessment/Plan:   Assessment 1) metastatic adenocarcinoma, probable GI source, large duodenal ulcer.  Continues to have rapidly declining platelet and rbc counts after tfx.  stool noted brown today.  patietn still has 4 radiation treatments to the region of the DU.  At some point further evaluation via either ugi series or repeat EGD, considering the platelet counts, after radiation treatments completed.    Plan as noted, will follow at a distance.   Electronic Signatures: Barnetta ChapelSkulskie, Gurnie Duris (MD)  (Signed [phone]-Nov-13 19:21)  Authored: Chief Complaint, VITAL SIGNS/ANCILLARY NOTES, Brief Assessment, Lab Results, Assessment/Plan   Last Updated: 04-Nov-13 19:21 by Barnetta ChapelSkulskie, Mekayla Soman (MD)

## 2014-10-02 NOTE — Consult Note (Signed)
Chief Complaint:   Subjective/Chief Complaint started radiation tx today.  denies n/v or abdominal pain. poor appetite, on tpn.   VITAL SIGNS/ANCILLARY NOTES: **Vital Signs.:   28-Oct-13 09:30   Vital Signs Type Routine   Temperature Temperature (F) 98.4   Celsius 36.8   Temperature Source Oral   Pulse Pulse 81   Respirations Respirations 18   Systolic BP Systolic BP 168   Diastolic BP (mmHg) Diastolic BP (mmHg) 80   Mean BP 109   Pulse Ox % Pulse Ox % 97   Oxygen Delivery Room Air/ 21 %   Brief Assessment:   Cardiac Regular    Respiratory clear BS    Gastrointestinal details normal Soft  Nontender  Nondistended  No masses palpable  Bowel sounds normal   Lab Results: Hepatic:  28-Oct-13 13:48    Albumin, Serum  2.4 (Result(s) reported on 11 Apr 2012 at 02:31PM.)  Routine Chem:  28-Oct-13 13:48    Sodium, Serum 144 (Result(s) reported on 11 Apr 2012 at 02:31PM.)   Phosphorus, Serum  2.0 (Result(s) reported on 11 Apr 2012 at 02:31PM.)   Assessment/Plan:  Assessment/Plan:   Assessment 1) duodenal ulcere, possible malignant, with recurrent GI bleeding.  2) metastatic adenocarcinoma in the setting of #1 with severe/disseminated bone mets.    Plan 1) continue ppi, will follow at a distance.   Electronic Signatures: Barnetta ChapelSkulskie, Jaskirat Schwieger (MD)  (Signed 28-Oct-13 15:43)  Authored: Chief Complaint, VITAL SIGNS/ANCILLARY NOTES, Brief Assessment, Lab Results, Assessment/Plan   Last Updated: 28-Oct-13 15:43 by Barnetta ChapelSkulskie, Mekel Haverstock (MD)

## 2014-10-02 NOTE — Consult Note (Signed)
Chief Complaint:   Subjective/Chief Complaint SLEEPING NOW AFTER ROXANOL  HAD EPISTAXIS SMALL AMOUNTS EARLIER. NOW S/P 2 UNITS PLTS TODAY   VITAL SIGNS/ANCILLARY NOTES: **Vital Signs.:   10-Nov-13 15:55   Vital Signs Type 15 min Post Blood Start Time   Temperature Temperature (F) 98.1   Celsius 36.7   Temperature Source oral   Pulse Pulse 72   Respirations Respirations 20   Systolic BP Systolic BP 148   Diastolic BP (mmHg) Diastolic BP (mmHg) 64   Mean BP 92   Pulse Ox % Pulse Ox % 93   Oxygen Delivery Room Air/ 21 %   Brief Assessment:   Additional Physical Exam PALLOR, SLEEPING, AROUSES EASILY,   NO COUGH, NO WHEEZE, LUNGS CLEAR, UPPER EXT STABLE EDEMA, PORT SITE BRUISED STABLE FROM YESTERDAY, NO BLEEDING NO HEMATOMA   Lab Results: Hepatic:  10-Nov-13 06:06    Albumin, Serum  2.7 (Result(s) reported on 24 Apr 2012 at [facility]06:52AM.)  Routine BB:  10-Nov-13 11:10    Platelets (Blood Component) Issued (Result(s) reported on 24 Apr 2012 at 03:21PM.)  Routine Chem:  10-Nov-13 06:06    Result Comment PLT - RESULTS VERIFIED BY REPEAT TESTING.  - NOTIFIED OF CRITICAL VALUE  - HP TO MARY WEATHERSPOON@0745 ,04/24/12  - READ-BACK PROCESS PERFORMED. WBC - VERIFIED BY SMEAR ESTIMATE  Result(s) reported on 24 Apr 2012 at 07:47AM.   Sodium, Serum 141 (Result(s) reported on 24 Apr 2012 at [facility]:52AM.)   Potassium, Serum 3.9 (Result(s) reported on 24 Apr 2012 at [facility]06:52AM.)   Magnesium, Serum 2.1 (1.8-2.4 THERAPEUTIC RANGE: 4-7 mg/dL TOXIC: > 10 mg/dL  -----------------------)   Phosphorus, Serum 2.6 (Result(s) reported on 24 Apr 2012 at [facility]06:52AM.)   Calcium (Total), Serum  7.7 (Result(s) reported on 24 Apr 2012 at [facility]06:52AM.)  Routine Hem:  10-Nov-13 06:06    WBC (CBC)  2.9   RBC (CBC)  2.19   Hemoglobin (CBC)  7.1   Hematocrit (CBC)  20.5   Platelet Count (CBC)  9   MCV 93   MCH 32.4   MCHC 34.7   RDW  35.1   Neutrophil % 94.1   Lymphocyte % 2.0   Monocyte % 3.9   Eosinophil % 0.0    Basophil % 0.0   Neutrophil # 4.4   Lymphocyte #  0.1   Monocyte # 0.2   Eosinophil # 0.0   Basophil # 0.0   Bands 6   Segmented Neutrophils 90   Lymphocytes 1   Monocytes 1   Metamyelocyte 1   Myelocyte 1   NRBC 22   Diff Comment 1 SCHISTOCYTES   Diff Comment 2 POIKILOCYTOSIS   Diff Comment 3 ANISOCYTOSIS   Diff Comment 4 MICROCYTES PRESENT   Diff Comment 5 PLTS VARIED IN SIZE  Result(s) reported on 24 Apr 2012 at 07:47AM.   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 MARROW FAILURE AND MICROANGIOPATHIC HEMOLYTIC ANEMIA. RESOLVED GI BLEED. EPISTAXIS WHEN PLTS LOW  S/P PORT PLACEMENT    Plan 1. WOULD DISCONTINUE HYPERALIMENTATION   2. WILL ACCESS PORT WHEN OK WITH VASCULAR SURGERY PRIOR TO DISCHARGE AND THEN REMOVE GROIN LINE  3. I HAVE D/C Q6H SOLUMEDROL, PATIENT HAD 2 DOSES TODAY, HAVE ORDERD PREDNISONE 60 MG PO TOMORROW AND THEN TAPER  4.AGREE WITH PLTS AND BLOOD TODAY  5, IF CONTINUED STABLE LOOKING AT DISCHARGE EARLY IN WEEK   Electronic Signatures:  Marin Roberts (MD)  (Signed [phone] 16:23)  Authored: Chief Complaint, VITAL SIGNS/ANCILLARY NOTES, Brief Assessment, Lab Results, Assessment/Plan   Last Updated: 10-Nov-13 16:23 by Marin Roberts (MD)

## 2014-10-02 NOTE — Consult Note (Signed)
Brief Consult Note: Diagnosis: GI bleed, duodenal ulcers.   Patient was seen by consultant.   Consult note dictated.   Recommend further assessment or treatment.   Discussed with Attending MD.   Comments: available if nonop measures fail.  Electronic Signatures: Lattie Hawooper, Cierah Crader E (MD)  (Signed 12-Oct-13 17:18)  Authored: Brief Consult Note   Last Updated: 12-Oct-13 17:18 by Lattie Hawooper, Danyah Guastella E (MD)

## 2014-10-02 NOTE — Consult Note (Signed)
Chief Complaint:   Subjective/Chief Complaint NO NEW COMPLAINTS, ANKLE PAIN   VITAL SIGNS/ANCILLARY NOTES: **Vital Signs.:   28-Oct-13 17:38   Vital Signs Type Q 4hr   Temperature Temperature (F) 97.8   Celsius 36.5   Temperature Source oral   Pulse Pulse 93   Respirations Respirations 18   Systolic BP Systolic BP 174   Diastolic BP (mmHg) Diastolic BP (mmHg) 82   Mean BP 112   Pulse Ox % Pulse Ox % 96   Pulse Ox Activity Level  At rest   Oxygen Delivery Room Air/ 21 %   Brief Assessment:   Cardiac Regular    Respiratory normal resp effort  clear BS    Gastrointestinal details normal Nontender    Additional Physical Exam PALOR ALERT AND COOPERATIVE  NEURO GROSSLY NON FOCAL   Lab Results: Hepatic:  28-Oct-13 13:48    Albumin, Serum  2.4 (Result(s) reported on 11 Apr 2012 at 02:31PM.)  Routine Chem:  28-Oct-13 13:48    Result Comment Schistocytes + NRBCs - SLIDE PREVIOUSLY REVIEWED BY PATHOLOGIST HGB, MCV + MCHC - Obtained using plasma replacement. PLatelet - VERIFIED BY SMEAR ESTIMATE WBC - VERIFIED BY SMEAR ESTIMATE  Result(s) reported on 11 Apr 2012 at 04:07PM.   Result Comment POTASSIUM,CALCIUM         - Serum/plasma was lipemic. Test was performed  - on ultrafuged specimen. MAGNESIUM - Serum/plasma was lipemic. Test was performed  - on ultrafuged specimen. CALCIUM - RESULTS VERIFIED BY REPEAT TESTING.  - C/CASEY [id number]  - NOTIFIED OF CRITICAL VALUE  - READ-BACK PROCESS PERFORMED.  Result(s) reported on 11 Apr 2012 at 04:12PM.   Sodium, Serum 144 (Result(s) reported on 11 Apr 2012 at 02:31PM.)   Potassium, Serum 4.2   Calcium (Total), Serum  6.8 (Result(s) reported on 11 Apr 2012 at 04:12PM.)   Magnesium, Serum  2.5 (1.8-2.4 THERAPEUTIC RANGE: 4-7 mg/dL TOXIC: > 10 mg/dL  -----------------------)   Phosphorus, Serum  2.0 (Result(s) reported on 11 Apr 2012 at 02:31PM.)  Routine Hem:  28-Oct-13 13:48    WBC (CBC) 7.6   RBC (CBC)  2.56    Hemoglobin (CBC)  8.4   Hematocrit (CBC)  23.6   Platelet Count (CBC)  36   MCV 92   MCH 32.8   MCHC 35.6   RDW  16.2   Bands 15   Segmented Neutrophils 61   Lymphocytes 8   Monocytes 7   Metamyelocyte 6   Myelocyte 3   NRBC 89   Diff Comment 1 DIMORPHIC RBCS   Diff Comment 2 SCHISTOCYTES   Diff Comment 3 SPHEROCYTES   Diff Comment 4 SMUDGE CELLS   Diff Comment 5 PLTS VARIED IN SIZE  Result(s) reported on 11 Apr 2012 at 04:07PM.   Assessment/Plan:  Assessment/Plan:   Assessment DUODENAL ULCER CONSIDERED MALIGNANT, PERSISTENT BLEEDING RECENTLY, NOT ACTIVELY BLEEDING AT THIS TIME BUT MULTIPLE PRIOR BLEEDS AND RISK OF REBLEED HIGH.  METASTATIC CANCER DIFFUSELY IN SKELETON AND BM INSUFFICIENCY. HEMOLYTIC MICROANGIOPATHIC ANEMIA. POOR NUTRITIONAL STATUS SKELETAL PAIN. ELECTROLYTE DISORDER    Plan STARTED RADIATION TX TODAY. LATER IF DUODENAL BLEEDING CONTROLLED, AND IF BLOOD COUNTS CAN BE SUPPORTED, CONSIDER ROLES OF CHEMOTX. WILL TRANFUSE PLTS WITH GOAL OF MAINTAINING ABOVE 50k UNTIL TX DELIVERED LIKELY TO CONTROL BLEEDING   Electronic Signatures: Marin RobertsGittin, Robert G (MD)  (Signed 28-Oct-13 23:13)  Authored: Chief Complaint, VITAL SIGNS/ANCILLARY NOTES, Brief Assessment, Lab Results, Assessment/Plan   Last Updated: 28-Oct-13 23:13 by Marin RobertsGittin, Robert G (MD)

## 2014-10-02 NOTE — Consult Note (Signed)
Chief Complaint:   Subjective/Chief Complaint Patietn seen re duodenal ulcer/gi bleed.  No abd pain no nausea, tolerating clears, no bm. Drop of hgb this am, no evidence of recurrent bleeding.   VITAL SIGNS/ANCILLARY NOTES: **Vital Signs.:   11-Oct-13 17:00   Vital Signs Type Blood Transfusion   Temperature Temperature (F) 98.2   Celsius 36.7   Temperature Source Oral   Pulse Pulse 104   Respirations Respirations 20   Systolic BP Systolic BP 103   Diastolic BP (mmHg) Diastolic BP (mmHg) 60   Mean BP 74   Pulse Ox % Pulse Ox % 96   Oxygen Delivery Room Air/ 21 %   Brief Assessment:   Cardiac Regular    Respiratory clear BS    Gastrointestinal details normal Soft  Nontender  Nondistended  No masses palpable  Bowel sounds normal   Lab Results: Hepatic:  07-Oct-13 12:09    Alkaline Phosphatase  658  08-Oct-13 06:01    Alkaline Phosphatase  569  10-Oct-13 05:26    Alkaline Phosphatase  871 (Result(s) reported on 24 Mar 2012 at 09:45AM.)  11-Oct-13 04:50    Alkaline Phosphatase  663 (Result(s) reported on 25 Mar 2012 at 06:13AM.)  Routine BB:  11-Oct-13 14:36    Direct Coombs, Polyspecific Negative (Result(s) reported on 25 Mar 2012 at 03:04PM.)  Routine Chem:  11-Oct-13 14:36    Glucose, Serum 94   BUN 17   Creatinine (comp)  0.52   Sodium, Serum 143   Potassium, Serum 4.2   Chloride, Serum  114   CO2, Serum  20   Calcium (Total), Serum  7.3   Anion Gap 9   Osmolality (calc) 286   eGFR (African American) >60   eGFR (Non-African American) >60 (eGFR values <60mL/min/1.73 m2 may be an indication of chronic kidney disease (CKD). Calculated eGFR is useful in patients with stable renal function. The eGFR calculation will not be reliable in acutely ill patients when serum creatinine is changing rapidly. It is not useful in  patients on dialysis. The eGFR calculation may not be applicable to patients at the low and high extremes of body sizes, pregnant women, and  vegetarians.)   LDH, Serum 246 (Result(s) reported on 25 Mar 2012 at 03:09PM.)  Routine Hem:  07-Oct-13 12:09    Hemoglobin (CBC)  7.3   Platelet Count (CBC)  109 (Result(s) reported on 21 Mar 2012 at 01:26PM.)    18:08    Hemoglobin (CBC)  6.5 (Result(s) reported on 21 Mar 2012 at 07:01PM.)  08-Oct-13 00:18    Hemoglobin (CBC)  9.5 (Result(s) reported on 22 Mar 2012 at 12:40AM.)    06:01    Hemoglobin (CBC)  9.4   Platelet Count (CBC)  81  09-Oct-13 06:15    Hemoglobin (CBC)  9.9   Platelet Count (CBC)  76 (Result(s) reported on 23 Mar 2012 at 09:25AM.)    18:33    Hemoglobin (CBC)  9.5 (Result(s) reported on 23 Mar 2012 at 07:00PM.)   Platelet Count (CBC)  69 (Result(s) reported on 23 Mar 2012 at 07:00PM.)  10-Oct-13 05:26    Hemoglobin (CBC)  10.8   Platelet Count (CBC)  83  11-Oct-13 04:50    Hemoglobin (CBC)  7.7   Platelet Count (CBC)  64 (Result(s) reported on 25 Mar 2012 at 06:13AM.)    09:10    Hemoglobin (CBC)  7.3   Platelet Count (CBC)  61    14:36    WBC (CBC) 6.9     RBC (CBC)  2.39   Hemoglobin (CBC)  7.2   Hematocrit (CBC)  20.5   Platelet Count (CBC)  65 (Result(s) reported on 25 Mar 2012 at 03:04PM.)   MCV 86   MCH 30.0   MCHC 35.0   RDW 14.0   Basophil # 0.0 (Result(s) reported on 25 Mar 2012 at 03:04PM.)   Segmented Neutrophils 83   Lymphocytes 12   Monocytes 1   Eosinophil 1   Metamyelocyte 1   Myelocyte 2   NRBC 1   Diff Comment 1 POLYCHROMASIA   Diff Comment 2 MICROCYTES PRESENT   Diff Comment 3 PLTS VARIED IN SIZE  Result(s) reported on 25 Mar 2012 at 03:04PM.   Retic Count  4.60   Absolute Retic Count  0.1097 (Result(s) reported on 25 Mar 2012 at 03:09PM.)   Assessment/Plan:  Assessment/Plan:   Assessment 1) melena-significant duodenal ulcer.  drop of hgb this am, no evidence of recurrent gi bleeding, no melena. Patient with positive h. pylori serology now on treatment, also h/o nsaid use. Transfusion today noted.  2) continued  thrombocytopenia, in the setting of anemia and possible bone pain right leg, concern for hematologic d/o.  Dr Cynda Acres following.    Plan 1) continue bid ppi and treatment for h. pylori.   2) consideration of abdominal ct if there is evidence of possible retroperitoneal bleeding in the setting of coagulopathy/thrombocytopenia.  currently this does not seem to be an issue.   3) awaiting furhter heme/onc evaluation.  discussed with Dr Cynda Acres and Dr Karsten Fells.   Dr Dionne Milo will be rounding this weekend.   Electronic Signatures: Loistine Simas (MD)  (Signed 11-Oct-13 17:56)  Authored: Chief Complaint, VITAL SIGNS/ANCILLARY NOTES, Brief Assessment, Lab Results, Assessment/Plan   Last Updated: 11-Oct-13 17:56 by Loistine Simas (MD)

## 2014-10-02 NOTE — Consult Note (Signed)
Chief Complaint:   Subjective/Chief Complaint When seen earlier this evening no acute complaints, no sob or pain, no melena, has recently passed dark stools following drop in hgb.   VITAL SIGNS/ANCILLARY NOTES: **Vital Signs.:   16-Oct-13 14:53   Pulse Pulse 80   Respirations Respirations 20   Systolic BP Systolic BP 474   Diastolic BP (mmHg) Diastolic BP (mmHg) 62   Mean BP 91   Pulse Ox % Pulse Ox % 99   Pulse Ox Activity Level  At rest   Oxygen Delivery Room Air/ 21 %   Pulse Ox Heart Rate 78    16:00   Pulse Ox % Pulse Ox % 97    19:00   Vital Signs Type 1 hr Post Blood   Temperature Temperature (F) 98.8   Celsius 37.1   Temperature Source oral   Pulse Pulse 76   Respirations Respirations 20   Systolic BP Systolic BP 259   Diastolic BP (mmHg) Diastolic BP (mmHg) 76   Mean BP 104   Pulse Ox % Pulse Ox % 96   Oxygen Delivery Room Air/ 21 %   Brief Assessment:   Respiratory normal resp effort    Gastrointestinal details normal Nontender    Additional Physical Exam aleert and cooperative  pallor  lymph nodes not palpable neck, supraclavicular submandibular axilla , bilateral breast exam no mass no skin changes   Lab Results: Routine BB:  16-Oct-13 09:30    Platelets (Blood Component) Issued (Result(s) reported on 30 Mar 2012 at 04:23PM.)   Platelets (Blood Component) Ready (Result(s) reported on 30 Mar 2012 at 02:49PM.)  Routine Chem:  16-Oct-13 00:17    Result Comment diff/nrbc - SLIDE PREVIOUSLY REVIEWED BY PATHOLOGIST  - mpg 03/30/12  Result(s) reported on 30 Mar 2012 at 01:09AM.    06:05    Result Comment wbc count  - VERIFIED BY SMEAR ESTIMATE diff/nrbc - SLIDE PREVIOUSLY REVIEWED BY PATHOLOGIST  - mpg 03/30/12  Result(s) reported on 30 Mar 2012 at 07:08AM.   Glucose, Serum 95   BUN 11   Creatinine (comp)  0.50   Sodium, Serum 142   Potassium, Serum 3.5   Chloride, Serum  112   CO2, Serum  20   Calcium (Total), Serum  7.6   Anion Gap 10   Osmolality  (calc) 282   eGFR (African American) >60   eGFR (Non-African American) >60 (eGFR values <24m/min/1.73 m2 may be an indication of chronic kidney disease (CKD). Calculated eGFR is useful in patients with stable renal function. The eGFR calculation will not be reliable in acutely ill patients when serum creatinine is changing rapidly. It is not useful in  patients on dialysis. The eGFR calculation may not be applicable to patients at the low and high extremes of body sizes, pregnant women, and vegetarians.)   Magnesium, Serum 1.9 (1.8-2.4 THERAPEUTIC RANGE: 4-7 mg/dL TOXIC: > 10 mg/dL  -----------------------)  Routine Hem:  16-Oct-13 00:17    WBC (CBC) 6.8   RBC (CBC)  3.50   Hemoglobin (CBC)  11.0   Hematocrit (CBC)  30.2   Platelet Count (CBC)  47   MCV 86   MCH 31.4   MCHC  36.4   RDW  15.4   Bands 6   Segmented Neutrophils 73   Lymphocytes 13   Monocytes 1   Eosinophil 1   Myelocyte 1   NRBC 9   Diff Comment 1 ANISOCYTOSIS   Diff Comment 2 POLYCHROMASIA   Diff Comment 3  PLTS VARIED IN SIZE  Result(s) reported on 30 Mar 2012 at 01:09AM.   Metamyelocyte 5    06:05    WBC (CBC) 6.6   RBC (CBC)  3.51   Hemoglobin (CBC)  11.0   Hematocrit (CBC)  30.3   Platelet Count (CBC)  40   MCV 86   MCH 31.5   MCHC  36.4   RDW  15.4   Bands 17   Segmented Neutrophils 62   Lymphocytes 11   Monocytes 5   Eosinophil 3   NRBC 14   Diff Comment 1 ANISOCYTOSIS   Diff Comment 2 POIKILOCYTOSIS   Diff Comment 3 POLYCHROMASIA   Diff Comment 4 MICROCYTES PRESENT   Metamyelocyte 2   Diff Comment 5 NORMAL PLT MORPHOLGY  Result(s) reported on 30 Mar 2012 at 07:08AM.    15:08    WBC (CBC) 6.9   RBC (CBC)  3.50   Hemoglobin (CBC)  10.9   Hematocrit (CBC)  30.5   Platelet Count (CBC)  35   MCV 87   MCH 31.2   MCHC 35.9   RDW  15.3   Neutrophil % 77.3   Lymphocyte % 15.0   Monocyte % 6.3   Eosinophil % 1.1   Basophil % 0.3   Neutrophil # 5.3   Lymphocyte # 1.0   Monocyte  # 0.4   Eosinophil # 0.1   Basophil # 0.0 (Result(s) reported on 30 Mar 2012 at 03:55PM.)   Bands 5   Segmented Neutrophils 72   Lymphocytes 11   Variant Lymphocytes 3   Monocytes 4   Eosinophil 3   Myelocyte 2   NRBC 3   Diff Comment 1 ANISOCYTOSIS   Diff Comment 2 POLYCHROMASIA   Diff Comment 3 HYPOCHROMIA   Diff Comment 4 POIKILOCYTOSIS  Result(s) reported on 30 Mar 2012 at 03:55PM.   Assessment/Plan:  Assessment/Plan:   Assessment 1.Recurrent bleeding ulcer, currently stable  2. High alk phos, some sclerotic lesions bone, now diffusely abnormal bone scan most likely wideely metastatic cancer to bone. Most likely small cell cancer or adeno or poorly differentiated cancer unknown origin, or breast with occult primary,  melanoma , lymphoma still a possibility, also very rare non secretory myeloma possible, as patient has hypogammaglobulinemia, but any histology possible. 3. Small diffuse lung nodules likely malignant  4. Thrombocytopenia most likely secondary to marrow infiltration, possible immune/itp element    Plan Discussed bone scan and likely diffuse cancer with patient. Explained that bone marrow bx is least invasive and at this time likely high yield for diagnosis, will plan for 10/17 if able, or 10/18. Discussed BM exam with patient who consents verbally. Re recent bleeding and thrombocytopenia, additional transfusion tonight, then check post transfusion counts.   Electronic Signatures: Dallas Schimke (MD)  (Signed 16-Oct-13 21:31)  Authored: Chief Complaint, VITAL SIGNS/ANCILLARY NOTES, Brief Assessment, Lab Results, Assessment/Plan   Last Updated: 16-Oct-13 21:31 by Dallas Schimke (MD)

## 2014-10-02 NOTE — Consult Note (Signed)
PATIENT NAME:  [name], Dara F MR#:  [id number] DATE OF BIRTH:  May 23, 1946  DATE OF CONSULTATION:  03/23/2012  REFERRING PHYSICIAN:   CONSULTING PHYSICIAN:  Knute Neuobert G. Gittin, MD  HISTORY OF PRESENT ILLNESS: Ms. Wynona NeatBuchanan is a 69 year old patient who takes primary care with Dr. Thana AtesMorrisey and was admitted on 10/07 with abdominal pain, dark stools, anemia, initially mild and then somewhat progressive thrombocytopenia, with a recent history of using nonsteroidal medications for leg pain. Patient also had had some upper abdominal pain and discomfort and burning sensation and admitted to some weight loss of about 15 pounds over a month or so, stated in one providers note as 10 pounds over 2 to 3 months. Patient thought it might have been 15 pounds over the last two months. She was unsure whether or not her p.o. intake had been reduced or not. She was not really aware of her loss of appetite and not had any diarrhea. She had been having some increased fatigue. Patient was hospitalized and seen by GI. She was given 2 units of blood with active GI bleed, hemoglobin down to 7.3. Upper endoscopy has been performed showing duodenal ulcers. Colonoscopy was planned for another time. Patient initially treated with intravenous fluids and a PPI drip, she had hyponatremia that was due to dehydration. With respect to lower extremity pain Doppler ultrasound ruled out thrombosis. She also had plain x-ray tibia and fibula that was unremarkable. Patient is currently pending an abdominal ultrasound to investigate mid epigastrium, right upper quadrant pain with tenderness and elevated alkaline phosphatase. Patient currently has no headache or dizziness, chills, or sweats. No current vomiting, is not nauseated now. Again, has some residual abdominal pain and some tenderness and is not passing blood in stools. No visual disturbance. No ear or jaw pain. No palpitations or retrosternal chest pain. No cough, wheezing or hemoptysis. No  dysuria or hematuria. No back or other bone pain. With regard to extremity pain she describes often pain at the foot and ankle, sometime up in the pretibial area up into the knee, occasionally involving the thigh and has at times had pain in the hip and buttock in the more classic sciatic fashion, at other times only has pain more distally. Has not had tenderness. There has been no extremity swelling. No rash or bruising.   PAST MEDICAL HISTORY:  1. Seizures.  2. Hyperlipidemia.  3. Osteoporosis.  4. Appendectomy.  5. Cholecystectomy.  6. Hysterectomy.   MEDICATIONS: 1. Norco p.r.n. for pain. 2. Aspirin 81 mg daily.  3. Cyclobenzaprine at night. 4. Keppra 1500 mg b.i.d.  5. Topamax 100 mg at bedtime and 75 in the morning. 6. Mobic 75 b.i.d. on admission has been on hold.   ALLERGIES: Tegretol, Dilantin and Depakote and metronidazole.   FAMILY HISTORY: Noncontributory, no malignancies.   SOCIAL HISTORY: Smokes 1/2 pack a day for 20 years as noted. No alcohol.   PHYSICAL EXAMINATION:  GENERAL: Patient is alert and cooperative in no acute distress. She has slight pallor. No jaundice. She is thin with some evidence of muscle wasting.   HEENT: No thrush in mouth.   LYMPH: No palpable lymph nodes in neck, supraclavicular, submandibular or axillae.   LUNGS: Clear. No wheezing or rales. Air entry was diminished.   HEART: Regular.   ABDOMEN: Tender in the epigastrium and the right upper quadrant with no definite mass or rebound. No definite organomegaly but there was some involuntary guarding.   EXTREMITIES: No extremity edema. No rash. No bruising.  NEUROLOGIC: Grossly nonfocal. Moving all extremities. I did not test her gait.   PSYCHIATRIC: Alert, cooperative, oriented, seems to have some memory lapses for her illness but alert, cooperative.   LABORATORY, DIAGNOSTIC, AND RADIOLOGICAL DATA: On admission sodium 135, albumin 3.3, alkaline phosphatase is elevated at 658, white count  8.3, hemoglobin 7.3, platelets 109. PT/INR 1.1. PTT was normal at 31.3. The platelet count has subsequently fallen slowly daily to current level of 69,000.   IMPRESSION: Patient has anemia with acute GI bleed and is found to have two duodenal ulcers somewhat severe, followed by GI. Plan is to repeat the endoscopy. Dehydration and hypernatremia has been addressed. There is underlying stable seizure disorder. Patient is a smoker, looks like only about a 10 year smoking history. With respect to the right extremity pain, there was no clot on ultrasound, plain x-ray is normal. Patient initially had tenderness in the bone as per Dr. Arlys John admission note. There was not tenderness at the current time. The pain waxed and waned, is often nocturnal but has been at other times and at other times pain is more characteristically in the sciatic distribution and at other times only distal.   There is anemia that may be completely due to blood loss, although it is unclear if there are other underlying causes of anemia, not automatically suspected or automatically apparent. Patient has thrombocytopenia which initially thought might be consumption from bleeding but platelets are still down, the patient does not appear to be actively bleeding so there is concern that there is primary hematologic disorder. This may or may not be the same etiology as the abnormally elevated alkaline phosphatase which could be a benign cause, could be bone or liver in origin and is again of concern for significant nonmalignant or malignant disease. Differential diagnosis at this point is very wide with consumption from the platelets, possibly drug effect, although usually her current medications would cause more anemia or possibly neutropenia and less likely isolated thrombocytopenia. If the platelets do not respond quickly with time and HIV and hepatitis C exposure and hypersplenism need to be considered plus idiopathic thrombocytopenia purpura.  There is no suspicion of TTP mechanism. The high alkaline phosphatase may be liver or bone and may not be related to leg pain which could be peripheral neuropathy or sciatic. The abdominal pain is likely ulcer disease but there is some concern about some other upper abdominal pathology.   PLAN: Would follow serial CBCs. Agree with getting abdominal ultrasound as the first step. Unless her platelets respond quickly will likely have to check HIV and hepatitis C and would want to document B12 level. For completeness would check reticulocytes and keep watch on all of the liver functions. After the abdominal ultrasound if there is any pathology there might be a role in the CT scan. Otherwise, if the high alkaline phosphatase source is unclear then potentially initially a bone scan. Depending on these studies also might later look at tumor markers, did not order those immediately. There is already a recommendation from GI to get a colonoscopy. Patient has a history that she is up to date on her mammogram.     I will follow daily with results.   ____________________________ Knute Neu Lorre Nick, MD rgg:cms D: 03/23/2012 22:38:11 ET T: 03/24/2012 07:06:56 ET JOB#: [id number]  cc: Knute Neu. Lorre Nick, MD, <Dictator> Marin Roberts MD ELECTRONICALLY SIGNED 03/24/2012 14:12

## 2014-10-02 NOTE — Consult Note (Signed)
General Aspect uncontrolled bleeding duodenal ulcer.    Present Illness The patient is a 69 year old female with past medical history significant for history of recent problems with right lower extremity pain, presented to the hospital with complaints of not feeling well. According to the patient, she was coming to the [facility] to have her leg checked again. She has been having increasing pains in the right lower extremity. It started approximately one week ago. She was seen at a walk-in clinic as well as by Dr. Rutherford Nail, and now she was about to go to a walk-in clinic again; however, because of them noticing the patient being very anemic, the patient was sent to the hospital for further evaluation.  Work up has shown a very large duodenal ulcer which was bleeding in association with multiple lesions.  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  Bone marrow involvement has resulted in rather severe thrombocytopenia.  I am asked to evaluate for possible embolization.  PAST MEDICAL/SURGICAL HISTORY:  1. History of seizure disorder.  2. Hyperlipidemia,  3. Hysterectomy.  4. Appendectomy.  5. Cholecystectomy.  6. Osteoporosis,   Home Medications: Medication Instructions Status  Keppra tablet 750 mg 2 tab(s)  once a day in the morning     patient takes 1/2 tab at noon and 2 1/2 tabs at night  Active  Topamax 100 mg oral tablet 1 tab(s) orally once a day (at bedtime) Active  acetaminophen-hydrocodone 300 mg-5 mg oral tablet 1 tab(s) orally every 8 hours, As Needed- for Pain  Active  Topamax 25 mg oral tablet 3 tab(s) orally once a day in the morning  51m in the am          Active  aspirin 81 mg oral tablet 1 tab(s) orally once a day Active  Topamax 25 mg oral tablet 3 tab(s) orally once a day (in the morning) Active    Tegretol: Rash  Depakote: Seizures  Dilantin: Seizures  Metronidazole: Seizures  Case History:   Family History Non-Contributory    Social  History negative tobacco, negative ETOH, negative Illicit drugs   Review of Systems:   Fever/Chills No    Cough No    Sputum No    Abdominal Pain No    Diarrhea No    Constipation No    Nausea/Vomiting Yes    SOB/DOE No    Chest Pain No    Telemetry Reviewed NSR    Dysuria No   Physical Exam:   GEN cachectic, moderate distress    HEENT pale conjunctivae, PERRL, dry oral mucosa    NECK supple  trachea midline    RESP normal resp effort  no use of accessory muscles    CARD regular rate  no JVD    ABD denies tenderness  soft    EXTR negative cyanosis/clubbing, negative edema    SKIN No rashes, No ulcers, skin turgor good    NEURO cranial nerves intact, follows commands    PSYCH alert, A+O to time, place, person, good insight   Nursing/Ancillary Notes: **Vital Signs.:   21-Oct-13 13:24   Vital Signs Type Routine   Temperature Temperature (F) 98   Celsius 36.6   Temperature Source Oral   Pulse Pulse 92   Respirations Respirations 20   Systolic BP Systolic BP 1160  Diastolic BP (mmHg) Diastolic BP (mmHg) 66   Mean BP 80   Pulse Ox % Pulse Ox % 94   Pulse Ox Activity  Level  At rest   Oxygen Delivery Room Air/ 21 %   Rhogam:  21-Oct-13 16:27    Notification PREVIOUSLY NOTIFIED   Notification Date NOT APPLICABLE   Notification Time NOT APPLICABLE  Result(s) reported on 04 Apr 2012 at 06:12PM.  Hepatic:  21-Oct-13 02:27    Bilirubin, Total 0.9   Alkaline Phosphatase  878   SGPT (ALT) 20   SGOT (AST)  41   Total Protein, Serum  4.2   Albumin, Serum  2.2  Routine BB:  21-Oct-13 04:38    Crossmatch Unit 1 Transfused   Crossmatch Unit 2 Transfused  Result(s) reported on 06 Apr 2012 at [facility].    16:26    Crossmatch Unit 1 Issued (Result(s) reported on 06 Apr 2012 at 02:02PM.)   Crossmatch Unit 1 Transfused  Result(s) reported on 05 Apr 2012 at 07:08AM.   Platelets (Blood Component) Transfused  Result(s) reported on 06 Apr 2012 at 06:23AM.    Platelets (Blood Component) Transfused  Result(s) reported on 06 Apr 2012 at 06:23AM.   Platelets (Blood Component) Transfused  Result(s) reported on 05 Apr 2012 at 07:08AM.   ABO Group + Rh Type A Positive   Antibody Screen POSITIVE (Result(s) reported on 04 Apr 2012 at 05:58PM.)    16:27    Crossmatch Unit 1 Transfused  Result(s) reported on 06 Apr 2012 at 06:23AM.   Antibody 1 Anti- KIDD B  Routine Chem:  21-Oct-13 02:27    Glucose, Serum 98   BUN 16   Creatinine (comp)  0.53   Sodium, Serum 145   Potassium, Serum  3.4   Chloride, Serum  115   CO2, Serum 21   Calcium (Total), Serum  6.9   Anion Gap 9   Osmolality (calc) 290   eGFR (African American) >60   eGFR (Non-African American) >60 (eGFR values <53m/min/1.73 m2 may be an indication of chronic kidney disease (CKD). Calculated eGFR is useful in patients with stable renal function. The eGFR calculation will not be reliable in acutely ill patients when serum creatinine is changing rapidly. It is not useful in  patients on dialysis. The eGFR calculation may not be applicable to patients at the low and high extremes of body sizes, pregnant women, and vegetarians.)   Result Comment MANUAL DIFFERENTIAL - SLIDE PREVIOUSLY REVIEWED BY PATHOLOGIST  - TPL  Result(s) reported on 04 Apr 2012 at 03:44AM.   Result Comment CALCIUM - NOTIFIED OF CRITICAL VALUE  - RESULTS VERIFIED BY REPEAT TESTING.  - CALLED TO CBeebe@0300 ,04/04/12  - READ-BACK PROCESS PERFORMED.  - TPL  Result(s) reported on 04 Apr 2012 at 03:11AM.  Routine Hem:  21-Oct-13 02:27    WBC (CBC) 6.4   RBC (CBC)  2.46   Hemoglobin (CBC)  7.3   Hematocrit (CBC)  20.9   Platelet Count (CBC)  43 (Result(s) reported on 04 Apr 2012 at 03:44AM.)   MCV 85   MCH 29.6   MCHC 34.9   RDW  15.5   Bands 6   Segmented Neutrophils 66   Lymphocytes 15   Monocytes 2   Eosinophil 1   Metamyelocyte 7   NRBC 17   Diff Comment 1 ANISOCYTOSIS   Diff Comment 2  POIKILOCYTOSIS   Diff Comment 3 PLTS VARIED IN SIZE   Myelocyte 3     Impression 1.  uncontrolled GI bleeding           will plan embolization 2.  Duodenal ulcer  on PPI and somatostatin gtt           GI following 3.  [place]            Oncology on consult. 4.  Seizure disorder             continue Keppra 5.  Anemia of blood loss              transfuse per medicine 6.  Thrombocytopenia              platelet transfusion in association with embolization.    Plan level 4   Electronic Signatures: Hortencia Pilar (MD)  (Signed 23-Oct-13 17:27)  Authored: General Aspect/Present Illness, Home Medications, Allergies, History and Physical Exam, Vital Signs, Labs, Impression/Plan   Last Updated: 23-Oct-13 17:27 by Hortencia Pilar (MD)

## 2014-10-02 NOTE — Consult Note (Signed)
Chief Complaint:   Subjective/Chief Complaint Feels much better today. No hematemesis since yesterday afternoon and no melena. Hgb = 9.0. Vitals improved.   VITAL SIGNS/ANCILLARY NOTES: **Vital Signs.:   13-Oct-13 09:47   Vital Signs Type Q 4hr   Temperature Temperature (F) 98.3   Celsius 36.8   Temperature Source oral   Pulse Pulse 82   Respirations Respirations 18   Systolic BP Systolic BP 264   Diastolic BP (mmHg) Diastolic BP (mmHg) 77   Mean BP 93   Pulse Ox % Pulse Ox % 97   Pulse Ox Activity Level  At rest   Oxygen Delivery Room Air/ 21 %   Brief Assessment:   Additional Physical Exam Abdomen is soft and benign.   Lab Results: Routine Chem:  13-Oct-13 02:16    Glucose, Serum  113   BUN  21   Creatinine (comp)  0.59   Sodium, Serum 143   Potassium, Serum 3.8   Chloride, Serum  115   CO2, Serum  19   Calcium (Total), Serum  7.0   Anion Gap 9   Osmolality (calc) 289   eGFR (African American) >60   eGFR (Non-African American) >60 (eGFR values <75m/min/1.73 m2 may be an indication of chronic kidney disease (CKD). Calculated eGFR is useful in patients with stable renal function. The eGFR calculation will not be reliable in acutely ill patients when serum creatinine is changing rapidly. It is not useful in  patients on dialysis. The eGFR calculation may not be applicable to patients at the low and high extremes of body sizes, pregnant women, and vegetarians.)   Result Comment CALCIUM - NOTIFIED OF CRITICAL VALUE  - RESULTS VERIFIED BY REPEAT TESTING.  - READ-BACK PROCESS PERFORMED.  - C/ANA MOORE 03-27-12 0236 SJL  Result(s) reported on 27 Mar 2012 at 02:37AM.   Result Comment CBC - SLIDE PREVIOUSLY REVIEWED BY PATHOLOGIST  Result(s) reported on 27 Mar 2012 at 04:49AM.  Routine Hem:  13-Oct-13 02:16    WBC (CBC) 4.0   RBC (CBC)  2.89   Hemoglobin (CBC)  9.0   Hematocrit (CBC)  25.2   Platelet Count (CBC)  50   MCV 87   MCH 31.0   MCHC 35.6   RDW 14.5    Bands 15   Segmented Neutrophils 68   Lymphocytes 10   Monocytes 3   Eosinophil 1   Metamyelocyte 3   NRBC 8   Diff Comment 1 ANISOCYTOSIS   Diff Comment 2 POIKILOCYTOSIS   Diff Comment 3 POLYCHROMASIA   Diff Comment 4 LARGE PLATELETS   Diff Comment 5 PLTS VARIED IN SIZE   Diff Comment 6 BASOPHILIC STIPPLING  Result(s) reported on 27 Mar 2012 at 04:49AM.   Assessment/Plan:  Assessment/Plan:   Assessment Recurrent UGI bleed secondary to duodenal ulcers. Now much better with no signs of active GI bleed.    Plan Continue Octreotide at 50 mics per hour. May reduce to 25 mics per hour tomorrow if no signs of a ctive bleeding. Continue PPI infusion . May switch to bid IV tomorrow if no further bleeding. Agree with full liquid diet. Probable DC in 48-72 hours if no further bleeding. ? Repeat EGD prior to discharge to r/o any high risk lesions. This will not be necessery if visulaization on initial EGD was adequate and no high risk lesion was found. Dr. SDonnella Shamwill resume care in am.   Electronic Signatures: IJill Side(MD)  (Signed 13-Oct-13 11:06)  Authored: Chief Complaint, VITAL  SIGNS/ANCILLARY NOTES, Brief Assessment, Lab Results, Assessment/Plan   Last Updated: 13-Oct-13 11:06 by Jill Side (MD)

## 2014-10-02 NOTE — Consult Note (Signed)
Chief Complaint:   Subjective/Chief Complaint NO ACUTE COMPLAINTS NO DIARRHEA NO MELENA NO SOB   VITAL SIGNS/ANCILLARY NOTES: **Vital Signs.:   17-Oct-13 15:47   Vital Signs Type Routine   Temperature Temperature (F) 97.3   Celsius 36.2   Temperature Source oral   Pulse Pulse 80   Respirations Respirations 18   Systolic BP Systolic BP 660   Diastolic BP (mmHg) Diastolic BP (mmHg) 70   Mean BP 101   Pulse Ox % Pulse Ox % 95   Pulse Ox Activity Level  At rest   Oxygen Delivery Room Air/ 21 %   Brief Assessment:   Additional Physical Exam ALERT AND COOPERATIVE PALLOR LUNGS CLEAR ABDO NON TENDER NO BRUISING NEURO GROSSLY NON FOCAL   Lab Results: Routine Chem:  17-Oct-13 04:00    Result Comment diff/nrbc - SLIDE PREVIOUSLY REVIEWED BY PATHOLOGIST  - mpg 03/31/12  Result(s) reported on 31 Mar 2012 at 05:02AM.   Glucose, Serum 91   BUN 9   Creatinine (comp)  0.48   Sodium, Serum 141   Potassium, Serum  3.0   Chloride, Serum  111   CO2, Serum  19   Calcium (Total), Serum  7.7   Anion Gap 11   Osmolality (calc) 280   eGFR (African American) >60   eGFR (Non-African American) >60 (eGFR values <38m/min/1.73 m2 may be an indication of chronic kidney disease (CKD). Calculated eGFR is useful in patients with stable renal function. The eGFR calculation will not be reliable in acutely ill patients when serum creatinine is changing rapidly. It is not useful in  patients on dialysis. The eGFR calculation may not be applicable to patients at the low and high extremes of body sizes, pregnant women, and vegetarians.)  Routine Hem:  17-Oct-13 04:00    WBC (CBC) 5.4   RBC (CBC)  3.15   Hemoglobin (CBC)  9.9   Hematocrit (CBC)  27.3   Platelet Count (CBC)  56   MCV 87   MCH 31.6   MCHC  36.5   RDW  16.1   Bands 10   Segmented Neutrophils 62   Lymphocytes 16   Variant Lymphocytes 1   Monocytes 4   Metamyelocyte 4   Myelocyte 3   NRBC 11   Diff Comment 1 ANISOCYTOSIS   Diff  Comment 2 POLYCHROMASIA   Diff Comment 3 MICROCYTES PRESENT   Diff Comment 4 PLTS VARIED IN SIZE  Result(s) reported on 31 Mar 2012 at 05:02AM.   Assessment/Plan:  Assessment/Plan:   Assessment SEE ALSO PRIOR NOTE   ABNORMAL BONE SCAN, HIGH ALK PHOS ANEMIA AND THROMBOCYTOPENIA DUODENAL ULCER......Marland KitchenBC HOLDING, HGB AND PLTS OK THIS AM.  RESPONSE TO TRANSFUSION S PLTS ADEQUATE BUT BLUNTED    Plan BONE MARROW ASP AND BX DONE EARLIER, CONSENTE OBTAINED , INDICATUION AS PRIOR NOTED, THROMBOCYTOPENIA AND SUSPECTED METASTATIC CANCER TO BONE.  WELLTOLERATED, SOM EPAIN AT PROCEEDURE, BLOOD LOSS APPROX 30 CC.  GOOD BONE LANDMARKS,  NEEDLE WELL PLACED , ADEQUATE ASPIRATE, DILUTE WITH FEW SPICULES, GOOD BONE MARROW CORE BX..Marland KitchenO BLEEDING AFTER PRESSURE APPLIED AT END OF PROCEEDURE, LATER PRESSURE KEPT TO SITE FOR 30 MONS, EXPECT PRELIMINARY REPORT TOMORROW.    TODAY WOULD STILL MAINTAIN PLTS ABOVE 50K   Electronic Signatures: GDallas Schimke(MD)  (Signed 17-Oct-13 17:29)  Authored: Chief Complaint, VITAL SIGNS/ANCILLARY NOTES, Brief Assessment, Lab Results, Assessment/Plan   Last Updated: 17-Oct-13 17:29 by GDallas Schimke(MD)

## 2014-10-02 NOTE — Consult Note (Signed)
Chief Complaint:   Subjective/Chief Complaint Unable to do egd today as planned, as other proceedures went long, and loss of anesthesia assistance.  clear liquid diet restarted.  Patietn stable over the course of the day, no recurrent melena, less abdominal pain, no nausea.  Plans for EGD first patient in the am with anesthesia assistance.   VITAL SIGNS/ANCILLARY NOTES: **Vital Signs.:   08-Oct-13 17:48   Vital Signs Type Q 4hr   Temperature Temperature (F) 98.1   Celsius 36.7   Temperature Source oral   Pulse Pulse 87   Respirations Respirations 18   Systolic BP Systolic BP 706   Diastolic BP (mmHg) Diastolic BP (mmHg) 76   Mean BP 95   Pulse Ox % Pulse Ox % 99   Pulse Ox Activity Level  At rest   Oxygen Delivery Room Air/ 21 %   Brief Assessment:   Cardiac Regular    Respiratory clear BS    Gastrointestinal details normal Soft  Nondistended  Bowel sounds normal  No rebound tenderness  mild discomfort in the epigastrum   Lab Results: Hepatic:  08-Oct-13 06:01    Bilirubin, Total 0.3   Alkaline Phosphatase  569   SGPT (ALT) 16   SGOT (AST) 26   Total Protein, Serum  4.7   Albumin, Serum  2.5  Routine Chem:  08-Oct-13 06:01    Glucose, Serum 86   BUN 8   Creatinine (comp) 0.60   Sodium, Serum 144   Potassium, Serum 3.6   Chloride, Serum  116   CO2, Serum  18   Calcium (Total), Serum  7.1   Osmolality (calc) 284   eGFR (African American) >60   eGFR (Non-African American) >60 (eGFR values <16m/min/1.73 m2 may be an indication of chronic kidney disease (CKD). Calculated eGFR is useful in patients with stable renal function. The eGFR calculation will not be reliable in acutely ill patients when serum creatinine is changing rapidly. It is not useful in  patients on dialysis. The eGFR calculation may not be applicable to patients at the low and high extremes of body sizes, pregnant women, and vegetarians.)   Anion Gap 10  Routine Hem:  07-Oct-13 12:09     Hemoglobin (CBC)  7.3    18:08    Hemoglobin (CBC)  6.5 (Result(s) reported on 21 Mar 2012 at 07:01PM.)  08-Oct-13 00:18    Hemoglobin (CBC)  9.5 (Result(s) reported on 22 Mar 2012 at 12:40AM.)    06:01    WBC (CBC) 6.4   RBC (CBC)  3.13   Hemoglobin (CBC)  9.4   Hematocrit (CBC)  26.7   Platelet Count (CBC)  81   MCV 85   MCH 30.1   MCHC 35.2   RDW 13.1   Neutrophil % 79.8   Lymphocyte % 13.6   Monocyte % 4.3   Eosinophil % 2.1   Basophil % 0.2   Neutrophil # 5.1   Lymphocyte #  0.9   Monocyte # 0.3   Eosinophil # 0.1   Basophil # 0.0 (Result(s) reported on 22 Mar 2012 at 06:29AM.)   Electronic Signatures: SLoistine Simas(MD)  (Signed 08-Oct-13 20:03)  Authored: Chief Complaint, VITAL SIGNS/ANCILLARY NOTES, Brief Assessment, Lab Results   Last Updated: 08-Oct-13 20:03 by SLoistine Simas(MD)

## 2014-10-02 NOTE — Consult Note (Signed)
Chief Complaint:   Subjective/Chief Complaint patient in specials for port placement.   chart reviewed, no change.  bm noted as brown rather than black, indicating less GI loss. I will not be available over the weekend, Dr Niel HummerIftikhar available if needed.   VITAL SIGNS/ANCILLARY NOTES: **Vital Signs.:   08-Nov-13 13:00   Vital Signs Type Blood Transfusion Complete   Temperature Temperature (F) 98.5   Celsius 36.9   Temperature Source oral   Pulse Pulse 91   Respirations Respirations 18   Systolic BP Systolic BP 150   Diastolic BP (mmHg) Diastolic BP (mmHg) 76   Mean BP 100   Oxygen Delivery Room Air/ 21 %   Electronic Signatures: Barnetta ChapelSkulskie, Orvin Netter (MD)  (Signed [phone]-Nov-13 16:52)  Authored: Chief Complaint, VITAL SIGNS/ANCILLARY NOTES   Last Updated: 08-Nov-13 16:52 by Barnetta ChapelSkulskie, Afua Hoots (MD)

## 2014-10-02 NOTE — Consult Note (Signed)
Chief Complaint:   Subjective/Chief Complaint no nausea vomiting or abdominal pain.  passing flatus, no bm.  On miralax.  No evidence of recurrent GI bleeding. s/p BMBx   VITAL SIGNS/ANCILLARY NOTES: **Vital Signs.:   17-Oct-13 11:10   Vital Signs Type Routine   Temperature Temperature (F) 98.1   Celsius 36.7   Temperature Source oral   Pulse Pulse 72   Respirations Respirations 17   Systolic BP Systolic BP 151   Diastolic BP (mmHg) Diastolic BP (mmHg) 73   Mean BP 100   Pulse Ox % Pulse Ox % 96   Pulse Ox Activity Level  At rest   Oxygen Delivery Room Air/ 21 %   Brief Assessment:   Cardiac Regular    Respiratory clear BS    Gastrointestinal details normal Soft  Nontender  Nondistended  No masses palpable  Bowel sounds normal   Lab Results: Routine Chem:  17-Oct-13 00:56    Result Comment MANUAL DIFF - CANCELLED DUE TO PT ALREADY HAD TO HAVE  - A MANUAL DIFF...DUPLICATE OF [id number]  - NYO/03-31-12  Result(s) reported on 31 Mar 2012 at 02:26PM.   Result Comment diff/nrbc - SLIDE PREVIOUSLY REVIEWED BY PATHOLOGIST  - mpg 03/31/12 labs - This specimen was collected through an   - indwelling catheter or arterial line.  - A minimum of 20ms of blood was wasted prior    - to collecting the sample.  Interpret  - results with caution.  Result(s) reported on 31 Mar 2012 at 01:52AM.    04:00    Result Comment diff/nrbc - SLIDE PREVIOUSLY REVIEWED BY PATHOLOGIST  - mpg 03/31/12  Result(s) reported on 31 Mar 2012 at 0[facility]   Result Comment labs - This specimen was collected through an   - indwelling catheter or arterial line.  - A minimum of 555m of blood was wasted prior    - to collecting the sample.  Interpret  - results with caution.  Result(s) reported on 31 Mar 2012 at 04:29AM.   Glucose, Serum 91   BUN 9   Creatinine (comp)  0.48   Sodium, Serum 141   Potassium, Serum  3.0   Chloride, Serum  111   CO2, Serum  19   Calcium (Total), Serum  7.7   Anion Gap 11    Osmolality (calc) 280   eGFR (African American) >60   eGFR (Non-African American) >60 (eGFR values <6065min/1.73 m2 may be an indication of chronic kidney disease (CKD). Calculated eGFR is useful in patients with stable renal function. The eGFR calculation will not be reliable in acutely ill patients when serum creatinine is changing rapidly. It is not useful in  patients on dialysis. The eGFR calculation may not be applicable to patients at the low and high extremes of body sizes, pregnant women, and vegetarians.)  Routine Hem:  17-Oct-13 00:56    WBC (CBC) 4.9   RBC (CBC)  3.18   Hemoglobin (CBC)  9.7   Hematocrit (CBC)  27.5   Platelet Count (CBC)  68   MCV 87   MCH 30.6   MCHC 35.4   RDW  15.4   Bands 10   Segmented Neutrophils 68   Lymphocytes 12   Variant Lymphocytes 1   Monocytes 3   Metamyelocyte 3   Myelocyte 3   NRBC 13   Diff Comment 1 ANISOCYTOSIS   Diff Comment 2 POLYCHROMASIA   Diff Comment 3 MICROCYTES PRESENT   Diff Comment 4 PLTS VARIED IN SIZE  Result(s) reported on 31 Mar 2012 at 01:52AM.   Manual Diff MANUAL DIFF DONE   Eosinophil 1    04:00    WBC (CBC) 5.4   RBC (CBC)  3.15   Hemoglobin (CBC)  9.9   Hematocrit (CBC)  27.3   Platelet Count (CBC)  56   MCV 87   MCH 31.6   MCHC  36.5   RDW  16.1   Bands 10   Segmented Neutrophils 62   Lymphocytes 16   Variant Lymphocytes 1   Monocytes 4   Metamyelocyte 4   Myelocyte 3   NRBC 11   Diff Comment 1 ANISOCYTOSIS   Diff Comment 2 POLYCHROMASIA   Diff Comment 3 MICROCYTES PRESENT   Diff Comment 4 PLTS VARIED IN SIZE  Result(s) reported on 31 Mar 2012 at 05:02AM.   Assessment/Plan:  Assessment/Plan:   Assessment 1) upper gi bleed-stable, no further bleeding since saturday (5 days).  severe DU. History of positive H. pylori and Nsaid use.  Currently on iv bid ppi and h. pylori treatment.  2) thrombocytopenia-abnormal bone scan.  bone marrow bx result pending.    Plan 1) continue current.   I will not be available until monday.  Dr Candace Cruise covering.   Electronic Signatures: Loistine Simas (MD)  (Signed 17-Oct-13 14:54)  Authored: Chief Complaint, VITAL SIGNS/ANCILLARY NOTES, Brief Assessment, Lab Results, Assessment/Plan   Last Updated: 17-Oct-13 14:54 by Loistine Simas (MD)

## 2014-10-02 NOTE — Consult Note (Signed)
Chief Complaint:   Subjective/Chief Complaint NO ACUTE COMPLAINTS NO DIARRHEA NO MELENA NO SOB   VITAL SIGNS/ANCILLARY NOTES: **Vital Signs.:   18-Oct-13 15:53   Vital Signs Type Blood Transfusion Complete   Temperature Temperature (F) 98.9   Celsius 37.1   Pulse Pulse 81   Respirations Respirations 18   Systolic BP Systolic BP 431   Diastolic BP (mmHg) Diastolic BP (mmHg) 65   Mean BP 92   Pulse Ox % Pulse Ox % 94   Pulse Ox Activity Level  At rest   Oxygen Delivery Room Air/ 21 %   Brief Assessment:   Additional Physical Exam ALERT AND COOPERATIVE PALLOR  ABDO NON TENDER  NEURO GROSSLY NON FOCAL   Lab Results: Hepatic:  18-Oct-13 16:08    Bilirubin, Total  1.9   Alkaline Phosphatase  816   SGPT (ALT) 19   SGOT (AST) 34   Total Protein, Serum  5.4   Albumin, Serum  2.9  Routine BB:  18-Oct-13 16:08    Direct Coombs, Polyspecific Negative (Result(s) reported on 01 Apr 2012 at 05:09PM.)  Routine Chem:  18-Oct-13 16:08    Glucose, Serum  114   BUN 8   Creatinine (comp)  0.46   Sodium, Serum 142   Potassium, Serum 3.7   Chloride, Serum  112   CO2, Serum 21   Calcium (Total), Serum  8.1   Osmolality (calc) 282   eGFR (African American) >60   eGFR (Non-African American) >60 (eGFR values <3m/min/1.73 m2 may be an indication of chronic kidney disease (CKD). Calculated eGFR is useful in patients with stable renal function. The eGFR calculation will not be reliable in acutely ill patients when serum creatinine is changing rapidly. It is not useful in  patients on dialysis. The eGFR calculation may not be applicable to patients at the low and high extremes of body sizes, pregnant women, and vegetarians.)   Anion Gap 9   LDH, Serum  593 (Result(s) reported on 01 Apr 2012 at 04:53PM.)   Result Comment CBC/NRBC - SLIDE PREVIOUSLY REVIEWED BY PATHOLOGIST  Result(s) reported on 01 Apr 2012 at 05:55PM.  Routine Hem:  18-Oct-13 16:08    WBC (CBC) 4.2   RBC (CBC)  2.40    Hemoglobin (CBC)  7.6   Hematocrit (CBC)  20.9   Platelet Count (CBC)  66   MCV 87   MCH 31.5   MCHC  36.3   RDW  16.0   Bands 9.   Segmented Neutrophils 59   Lymphocytes 20   Monocytes 7   Eosinophil 2   Basophil 0   Metamyelocyte 2   Myelocyte 1   NRBC 8   Diff Comment 1 ANISOCYTOSIS   Diff Comment 2 POLYCHROMASIA   Diff Comment 3 PLTS VARIED IN SIZE  Result(s) reported on 01 Apr 2012 at 05:55PM.   Assessment/Plan:  Assessment/Plan:   Assessment SEE ALSO PRIOR NOTES   , 1. ANEMIA AND THROMBOCYTOPENIA  2. DUODENAL ULCER.. 3. 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  4. RESPONSE TO TRANSFUSION  PLTS ADEQUATE BUT BLUNTED.   TODAY PLTS HOLDING, 35K 4AM, THEN PLT TRANSFUSE, 50K AT NOON, THEN PLT TRANSFUSED, LAST CHECK 65K. HGB DRIFTING DOWN TO 7.6, NO MELENA. SMEAR SHOWS CONSISTENT WITH MARROW INFILTRATION AND MICROANGIOPATHY, THERE IS LIKELY AN ELEMENT OF HEMOLYSIS, CERTAINLY INEFFECTIVE HEMATOPOESIS. CRE HAS REMAINED NORMAL, DO NOT SUSPECT ITP AND DO NOT WANT TO GIVE EMPIRIC STEROIDS. DO NOT SUSPECT TTP GIVEN HX AND OTHER FINDINGS.  5.TODAY PRELIMINARY REPORT FROM PATHOLOGY, METASTATIC CANCER SEEN IN BONE MARROW    Plan 1  TODAY WOULD STILL MAINTAIN PLTS ABOVE 50K  2. TRANSFUSE PRN TO MAINTAIN HGB  3. DISCUSSED DX WITH PATIENT, WILL HAVE ADDITIONAL RESULTS OF IMMUNOSTAINING ON 10/21, WILL LOOK AT POSSIBLE SYSTEMIC CHEMOTX AS EARLY AS 10/22   Electronic Signatures: Dallas Schimke (MD)  (Signed 18-Oct-13 18:28)  Authored: Chief Complaint, VITAL SIGNS/ANCILLARY NOTES, Brief Assessment, Lab Results, Assessment/Plan   Last Updated: 18-Oct-13 18:28 by Dallas Schimke (MD)

## 2014-10-02 NOTE — Consult Note (Signed)
Chief Complaint:   Subjective/Chief Complaint denies n/v or abdominal pain.  small bm after enema.   VITAL SIGNS/ANCILLARY NOTES: **Vital Signs.:   16-Oct-13 12:00   Temperature Temperature (F) 98.3   Celsius 36.8   Temperature Source oral   Pulse Pulse 76   Respirations Respirations 20   Systolic BP Systolic BP 384   Diastolic BP (mmHg) Diastolic BP (mmHg) 62   Mean BP 89   Pulse Ox % Pulse Ox % 99   Pulse Ox Activity Level  At rest   Oxygen Delivery Room Air/ 21 %   Pulse Ox Heart Rate 76  *Intake and Output.:   16-Oct-13 11:30   Stool  very small loose dark brown stool.   Brief Assessment:   Cardiac Regular    Respiratory clear BS    Gastrointestinal details normal Soft  Nondistended  No masses palpable  Bowel sounds normal  No rebound tenderness  minimal generalized discomfort   Lab Results: Routine Chem:  16-Oct-13 00:17    Result Comment diff/nrbc - SLIDE PREVIOUSLY REVIEWED BY PATHOLOGIST  - mpg 03/30/12  Result(s) reported on 30 Mar 2012 at 01:09AM.    06:05    Result Comment wbc count  - VERIFIED BY SMEAR ESTIMATE diff/nrbc - SLIDE PREVIOUSLY REVIEWED BY PATHOLOGIST  - mpg 03/30/12  Result(s) reported on 30 Mar 2012 at 07:08AM.   Glucose, Serum 95   BUN 11   Creatinine (comp)  0.50   Sodium, Serum 142   Potassium, Serum 3.5   Chloride, Serum  112   CO2, Serum  20   Calcium (Total), Serum  7.6   Anion Gap 10   Osmolality (calc) 282   eGFR (African American) >60   eGFR (Non-African American) >60 (eGFR values <84m/min/1.73 m2 may be an indication of chronic kidney disease (CKD). Calculated eGFR is useful in patients with stable renal function. The eGFR calculation will not be reliable in acutely ill patients when serum creatinine is changing rapidly. It is not useful in  patients on dialysis. The eGFR calculation may not be applicable to patients at the low and high extremes of body sizes, pregnant women, and vegetarians.)   Magnesium, Serum 1.9  (1.8-2.4 THERAPEUTIC RANGE: 4-7 mg/dL TOXIC: > 10 mg/dL  -----------------------)  Routine Hem:  16-Oct-13 00:17    WBC (CBC) 6.8   RBC (CBC)  3.50   Hemoglobin (CBC)  11.0   Hematocrit (CBC)  30.2   Platelet Count (CBC)  47   MCV 86   MCH 31.4   MCHC  36.4   RDW  15.4   Bands 6   Segmented Neutrophils 73   Lymphocytes 13   Monocytes 1   Eosinophil 1   Metamyelocyte 5   NRBC 9   Diff Comment 1 ANISOCYTOSIS   Diff Comment 2 POLYCHROMASIA   Diff Comment 3 PLTS VARIED IN SIZE  Result(s) reported on 30 Mar 2012 at 01:09AM.   Myelocyte 1    06:05    WBC (CBC) 6.6   RBC (CBC)  3.51   Hemoglobin (CBC)  11.0   Hematocrit (CBC)  30.3   Platelet Count (CBC)  40   MCV 86   MCH 31.5   MCHC  36.4   RDW  15.4   Bands 17   Segmented Neutrophils 62   Lymphocytes 11   Monocytes 5   Eosinophil 3   Metamyelocyte 2   NRBC 14   Diff Comment 1 ANISOCYTOSIS   Diff Comment 2 POIKILOCYTOSIS  Diff Comment 3 POLYCHROMASIA   Diff Comment 4 MICROCYTES PRESENT   Diff Comment 5 NORMAL PLT MORPHOLGY  Result(s) reported on 30 Mar 2012 at 07:08AM.   Assessment/Plan:  Assessment/Plan:   Assessment 1) duodenal ulcer, severe.  Positive H. pylori, h/o nsaids. no recueernt bleeding since saturday.  HGB stable s/p tfx.  continues with thrombocytopenia. no abdominal pain.    Plan 1) continue current.  awaiting bone scan result   Electronic Signatures: Loistine Simas (MD)  (Signed 16-Oct-13 13:13)  Authored: Chief Complaint, VITAL SIGNS/ANCILLARY NOTES, Brief Assessment, Lab Results, Assessment/Plan   Last Updated: 16-Oct-13 13:13 by Loistine Simas (MD)

## 2014-10-02 NOTE — Consult Note (Signed)
Chief Complaint:   Subjective/Chief Complaint small amount of brown emesis this am, no recurrent, not red.  denies current nausea or abdominal pain.   VITAL SIGNS/ANCILLARY NOTES: **Vital Signs.:   23-Oct-13 07:37   Vital Signs Type Routine   Temperature Temperature (F) 97.5   Celsius 36.3   Temperature Source oral   Pulse Pulse 95   Respirations Respirations 20   Systolic BP Systolic BP 086   Diastolic BP (mmHg) Diastolic BP (mmHg) 71   Mean BP 86   Pulse Ox % Pulse Ox % 95   Pulse Ox Activity Level  At rest   Oxygen Delivery Room Air/ 21 %  *Intake and Output.:   23-Oct-13 07:57   Emesis ml     Out:  30   Brief Assessment:   Cardiac Regular    Respiratory clear BS    Gastrointestinal details normal Soft  Nontender  Nondistended  No masses palpable  Bowel sounds normal   Lab Results: Routine Chem:  23-Oct-13 04:57    Glucose, Serum  193   BUN 15   Creatinine (comp)  0.49   Sodium, Serum  146   Potassium, Serum 3.7   Chloride, Serum  117   CO2, Serum  18   Calcium (Total), Serum  6.6   Anion Gap 11   Osmolality (calc) 297   eGFR (African American) >60   eGFR (Non-African American) >60 (eGFR values <72m/min/1.73 m2 may be an indication of chronic kidney disease (CKD). Calculated eGFR is useful in patients with stable renal function. The eGFR calculation will not be reliable in acutely ill patients when serum creatinine is changing rapidly. It is not useful in  patients on dialysis. The eGFR calculation may not be applicable to patients at the low and high extremes of body sizes, pregnant women, and vegetarians.)   Result Comment CALCIUM - RESULTS VERIFIED BY REPEAT TESTING.  - NOTIFIED OF CRITICAL VALUE  - C/ MALKA SINWANY @0530  04-06-12. AJO  - READ-BACK PROCESS PERFORMED.  Result(s) reported on 06 Apr 2012 at 05:32AM.    09:47    Triglycerides, Serum 104 (Result(s) reported on 06 Apr 2012 at 10:33AM.)   Cholesterol, Serum 66 (Result(s) reported on 06 Apr 2012 at 10:33AM.)   Phosphorus, Serum  1.8 (Result(s) reported on 06 Apr 2012 at 10:33AM.)   Magnesium, Serum  1.5 (1.8-2.4 THERAPEUTIC RANGE: 4-7 mg/dL TOXIC: > 10 mg/dL  -----------------------)  Routine Hem:  23-Oct-13 04:57    WBC (CBC) 7.5   RBC (CBC)  2.33   Hemoglobin (CBC)  7.3   Hematocrit (CBC)  20.8   Platelet Count (CBC)  44 (Result(s) reported on 06 Apr 2012 at 06:41AM.)   MCV 89   MCH 31.3   MCHC 35.1   RDW 13.8   Bands 10   Segmented Neutrophils 61   Lymphocytes 15   Monocytes 7   Eosinophil 1   Metamyelocyte 5   NRBC 39   Diff Comment 1 POLYCHROMASIA   Diff Comment 2 HYPOCHROMIA   Diff Comment 3 NORMAL PLT MORPHOLGY  Result(s) reported on 06 Apr 2012 at 06:41AM.   Assessment/Plan:  Assessment/Plan:   Assessment 1) severe duodenal ulcer in the setting of positive h pylori and nsaid use.  s/p microembolization, no recurrent bleed at this point.  2) metastatic bone ca 3) thrombocytopenia/anemia.    Plan 1) discussed with Dr VCharissa Bash aree with another unit of prbc and platelets.  drop from yesterday see this am likely dilutional.  continue ppi drip, recommend decrease octreotide to half dose for 1-2 days then d.c.  following   Electronic Signatures: Loistine Simas (MD)  (Signed 23-Oct-13 13:10)  Authored: Chief Complaint, VITAL SIGNS/ANCILLARY NOTES, Brief Assessment, Lab Results, Assessment/Plan   Last Updated: 23-Oct-13 13:10 by Loistine Simas (MD)

## 2014-10-02 NOTE — Consult Note (Signed)
Chief Complaint:   Subjective/Chief Complaint Pt without complaints today. No more hematemesis. Melena yest. Transfused yest. Back on clear liquid diet. Looking more jaundiced.   VITAL SIGNS/ANCILLARY NOTES: **Vital Signs.:   20-Oct-13 08:37   Vital Signs Type Routine   Temperature Temperature (F) 97.5   Celsius 36.3   Temperature Source oral   Pulse Pulse 88   Respirations Respirations 18   Systolic BP Systolic BP 125   Diastolic BP (mmHg) Diastolic BP (mmHg) 66   Mean BP 85   Pulse Ox % Pulse Ox % 96   Pulse Ox Activity Level  At rest   Oxygen Delivery Room Air/ 21 %   Brief Assessment:   Cardiac Regular    Respiratory clear BS    Gastrointestinal Normal   Lab Results: Routine Chem:  20-Oct-13 01:00    Result Comment LABS - This specimen was collected through an   - indwelling catheter or arterial line.  - A minimum of 5mls of blood was wasted prior    - to collecting the sample.  Interpret  - results with caution.  Result(s) reported on 03 Apr 2012 at 01:49AM.  Routine Hem:  20-Oct-13 01:00    Platelet Count (CBC)  36 (Result(s) reported on 03 Apr 2012 at 08:06AM.)   Hemoglobin (CBC)  6.9   Assessment/Plan:  Assessment/Plan:   Assessment Duodenal ulcer. No further vomiting after stopping KCl. Prob bone cancer.    Plan Moniter LFT and Hgb/PLTC. Continue PPI. Dr. Marva PandaSkulskie to see patient tomorrow. Thanks   Electronic Signatures: Lutricia Feilh, Elizzie Westergard (MD)  (Signed 20-Oct-13 09:24)  Authored: Chief Complaint, VITAL SIGNS/ANCILLARY NOTES, Brief Assessment, Lab Results, Assessment/Plan   Last Updated: 20-Oct-13 09:24 by Lutricia Feilh, Farrie Sann (MD)

## 2014-10-02 NOTE — Consult Note (Signed)
Chief Complaint:   Subjective/Chief Complaint denies abdominal pain or emesis, no bm, mild nausea this am.   VITAL SIGNS/ANCILLARY NOTES: **Vital Signs.:   27-Oct-13 14:05   Vital Signs Type Blood Transfusion   Temperature Temperature (F) 98.3   Celsius 36.8   Temperature Source oral   Pulse Pulse 80   Respirations Respirations 20   Systolic BP Systolic BP 146   Diastolic BP (mmHg) Diastolic BP (mmHg) 75   Mean BP 98   Pulse Ox % Pulse Ox % 96   Brief Assessment:   Cardiac Regular    Respiratory clear BS    Gastrointestinal details normal Soft  Nontender  Nondistended  No masses palpable  Bowel sounds normal   Lab Results: Routine BB:  27-Oct-13 09:04    Crossmatch Unit 1 Issued (Result(s) reported on 10 Apr 2012 at 11:32AM.)  Routine Chem:  27-Oct-13 06:19    Result Comment LABS - This specimen was collected through an   - indwelling catheter or arterial line.  - A minimum of of blood was wasted prior    - to collecting the sample.  Interpret  - results with caution. PLATELET - NOTIFIED OF CRITICAL VALUE  - VERIFIED BY SMEAR ESTIMATE  - NYO TO CHRIS BENNETT/0800/04-10-12  - READ-BACK PROCESS PERFORMED. SMEAR - SLIDE PREVIOUSLY REVIEWED BY PATHOLOGIST WBC - RESULT CORRECTED FOR NUCLEATED RBCS  Result(s) reported on 10 Apr 2012 at 08:06AM.    07:00    Sodium, Serum 144 (Result(s) reported on 10 Apr 2012 at 07:43AM.)   Potassium, Serum 4.1 (Result(s) reported on 10 Apr 2012 at 07:43AM.)   Calcium (Total), Serum  7.1 (Result(s) reported on 10 Apr 2012 at 07:43AM.)   Magnesium, Serum 2.2 (1.8-2.4 THERAPEUTIC RANGE: 4-7 mg/dL TOXIC: > 10 mg/dL  -----------------------)   Phosphorus, Serum  2.3 (Result(s) reported on 10 Apr 2012 at 07:43AM.)    09:04    Result Comment xmatch - 1 unit is up for this patient.    15:38    Result Comment LABS - This specimen was collected through an   - indwelling catheter or arterial line.  - A minimum of of blood was wasted  prior    - to collecting the sample.  Interpret  - results with caution.  Result(s) reported on 10 Apr 2012 at 04:17PM.   LDH, Serum  970  Routine Hem:  27-Oct-13 06:19    WBC (CBC) 7.6   RBC (CBC)  2.09   Hemoglobin (CBC)  6.5   Hematocrit (CBC)  19.5   Platelet Count (CBC)  10   MCV 93   MCH 30.9   MCHC 33.1   RDW  16.5   Bands 14   Segmented Neutrophils 65   Lymphocytes 11   Variant Lymphocytes 2   Monocytes 5   Metamyelocyte 2   Myelocyte 1   NRBC 52   Diff Comment 1 ANISOCYTOSIS   Diff Comment 2 POIKILOCYTOSIS   Diff Comment 3 POLYCHROMASIA   Diff Comment 4 MICROCYTES PRESENT   Diff Comment 5 MACROCYTES PRESENT   Diff Comment 6 SCHISTOCYTES   Diff Comment 7 HELMET CELLS   Diff Comment 8 PLTS VARIED IN SIZE  Result(s) reported on 10 Apr 2012 at 08:06AM.   Assessment/Plan:  Assessment/Plan:   Assessment 1) upper GI bleeding-DU versus primary for metastatic adenocarcinoma.   2) anemia/thrombocytopenia-multifactorial, multiple tfx 3) metastatic adenocarcinoma    Plan 1) no new GI recs, will place on carafate in addition to  ppi.   Electronic Signatures: Barnetta ChapelSkulskie, Martin (MD)  (Signed 27-Oct-13 17:01)  Authored: Chief Complaint, VITAL SIGNS/ANCILLARY NOTES, Brief Assessment, Lab Results, Assessment/Plan   Last Updated: 27-Oct-13 17:01 by Barnetta ChapelSkulskie, Martin (MD)

## 2014-10-02 NOTE — Consult Note (Signed)
Chief Complaint:   Subjective/Chief Complaint doing well, less abdominal discomfort, no nausea.  no bm yet.   VITAL SIGNS/ANCILLARY NOTES: **Vital Signs.:   10-Oct-13 13:52   Vital Signs Type Routine   Temperature Temperature (F) 97.9   Celsius 36.6   Temperature Source Oral   Pulse Pulse 87   Respirations Respirations 20   Systolic BP Systolic BP 123   Diastolic BP (mmHg) Diastolic BP (mmHg) 71   Mean BP 88   Pulse Ox % Pulse Ox % 95   Pulse Ox Activity Level  At rest   Oxygen Delivery Room Air/ 21 %   Brief Assessment:   Cardiac Regular    Respiratory clear BS    Gastrointestinal details normal Soft  Nontender  Nondistended  No masses palpable  Bowel sounds normal   Lab Results: Hepatic:  07-Oct-13 12:09    Alkaline Phosphatase  658  08-Oct-13 06:01    Alkaline Phosphatase  569  10-Oct-13 05:26    Alkaline Phosphatase  871 (Result(s) reported on 24 Mar 2012 at 09:45AM.)  General Ref:  09-Oct-13 14:45    Helicobacter pylori AB. IgG, IgA, IgM ========== TEST NAME ==========  ========= RESULTS =========  = REFERENCE RANGE =  HELICOBACTER P. AB PANEL  H pylori, IgM, IgG, IgA Ab H. pylori, IgG Abs              [H  4.5 U/mL             ]           0.0-0.8             Negative            <0.9                                             Indeterminate  0.9 - 1.0                                             Positive            >1.0 H. pylori, IgA Abs              [   Result Pending       ]    H. pylori, IgM Abs              [   Result Pending       ]                                 [facility] [place]            No: [id number]           [address], [place], [place] [id number]           Mila HomerWilliam F Hancock, MD         [phone]   Result(s) reported on 24 Mar 2012 at 03:19PM.  Routine Chem:  10-Oct-13 05:26    Result Comment plt - SLIGHT PLATELET CLUMPING IN SPECIMEN. ACTUAL  - NUMERICAL COUNT MAY BE SOMEWHAT HIGHER THAN  - THE REPORTED VALUE.  Result(s)  reported on 24 Mar 2012 at 09:14AM.  Routine  Hem:  10-Oct-13 05:26    WBC (CBC) 8.2   RBC (CBC)  3.61   Hemoglobin (CBC)  10.8   Hematocrit (CBC)  31.2   Platelet Count (CBC)  83   MCV 86   MCH 30.0   MCHC 34.7   RDW 13.7   Bands 5   Segmented Neutrophils 82   Lymphocytes 10   Monocytes 2   Eosinophil 1   NRBC 1   Diff Comment 1 ANISOCYTOSIS   Diff Comment 2 POIKILOCYTOSIS   Diff Comment 3 POLYCHROMASIA   Diff Comment 4 NORMAL PLT MORPHOLGY  Result(s) reported on 24 Mar 2012 at 09:14AM.   Assessment/Plan:  Assessment/Plan:   Assessment 1) upper gi bleed due to large duodenal ulcer.  2) positive helicobacter pylori serology-no previous treatment or known history of h. pylori.  3) thrombocytopenia-consumption versus other-abdominal ultrasound not informative.  4) elevated alkaline phos-increased today.    Plan 1) will start treatment for h. pylori-biaxin 500 mg po bid, ampicillin 1000 mg po bid, bid ppi.  2) limited full liquid diet.  3) note from Dr Lorre Nick appreciated, agree with bone scan.   Electronic Signatures: Barnetta Chapel (MD)  (Signed 10-Oct-13 16:05)  Authored: Chief Complaint, VITAL SIGNS/ANCILLARY NOTES, Brief Assessment, Lab Results, Assessment/Plan   Last Updated: 10-Oct-13 16:05 by Barnetta Chapel (MD)

## 2014-10-02 NOTE — Consult Note (Signed)
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   Patient was seen by consultant.   Recommend to proceed with surgery or procedure.   Orders entered.   Discussed with Attending MD.   Comments: will plan embolization in am.  Electronic Signatures: Levora DredgeSchnier, Malaney Mcbean (MD)  (Signed 21-Oct-13 19:14)  Authored: Brief Consult Note   Last Updated: 21-Oct-13 19:14 by Levora DredgeSchnier, Linden Mikes (MD)

## 2014-10-02 NOTE — Consult Note (Signed)
Chief Complaint:   Subjective/Chief Complaint NO ACUTE COMPLAINS, NO SOB, CURRENTLY NO LEG PAIN, NO ABDO PAIN, NO MELENA TODAY   VITAL SIGNS/ANCILLARY NOTES: **Vital Signs.:   04-Nov-13 17:00   Vital Signs Type Pre-Blood   Temperature Temperature (F) 98.2   Celsius 36.7   Temperature Source Oral   Pulse Pulse 75   Respirations Respirations 18   Systolic BP Systolic BP 154   Diastolic BP (mmHg) Diastolic BP (mmHg) 75   Mean BP 101   Pulse Ox % Pulse Ox % 95   Oxygen Delivery Room Air/ 21 %   Brief Assessment:   Respiratory normal resp effort  clear BS    Gastrointestinal details normal Nontender    Additional Physical Exam PALLOR, ALERT AND COOPERATIVE, NEURO GROSSLY NON FOCAL, MOOD NORMAL, CALM, AFFECT  NOT FLAT OR  DEPRESSED , ABDO NON TENDER   Lab Results: Routine BB:  04-Nov-13 12:00    Platelets (Blood Component) Issued (Result(s) reported on 18 Apr 2012 at 04:52PM.)  Routine Chem:  04-Nov-13 04:29    Sodium, Serum 142 (Result(s) reported on 18 Apr 2012 at 06:49AM.)   Potassium, Serum 4.2 (Result(s) reported on 18 Apr 2012 at 05:18AM.)   Phosphorus, Serum 2.7 (Result(s) reported on 18 Apr 2012 at 05:18AM.)   Magnesium, Serum 2.3 (1.8-2.4 THERAPEUTIC RANGE: 4-7 mg/dL TOXIC: > 10 mg/dL  -----------------------)   Calcium (Total), Serum  7.1 (Result(s) reported on 18 Apr 2012 at 05:18AM.)   Result Comment wbc - RESULT CORRECTED FOR NUCLEATED RBCS cbc - SLIDE PREVIOUSLY REVIEWED BY PATHOLOGIST plt - VERIFIED BY SMEAR ESTIMATE  Result(s) reported on 18 Apr 2012 at 07:26AM.  Routine Hem:  04-Nov-13 04:29    WBC (CBC) 9.0   RBC (CBC)  2.40   Hemoglobin (CBC)  7.5   Hematocrit (CBC)  22.3   Platelet Count (CBC)  44   MCV 93   MCH 31.1   MCHC 33.4   RDW  26.2   Bands 4   Segmented Neutrophils 82   Lymphocytes 7   Monocytes 3   Metamyelocyte 3   Myelocyte 1   NRBC 36   Diff Comment 1 ANISOCYTOSIS   Diff Comment 2 POLYCHROMASIA   Diff Comment 3 POIKILOCYTOSIS    Diff Comment 4 SCHISTOCYTES  Result(s) reported on 18 Apr 2012 at 07:26AM.   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 HGB AND PLTS MODERATE LOW, OVERALL LOOKS LIKE BLEEDING IS SLOWED    Plan F/U PLTS AND HGB, TRANSFUSE PRN, CONTINUE XRT, CONSIDER IF STABLE ENOUGH AND PERFORMANCE STATUS ADEQUATE TO ATTEMPT CHEMOTX LATER. PLANNING TO RECONSULT VASCULAR SURGERY FOR  PORT FOR ACCESS TO REPLACE GROIN LINE, AND THEN TRY TO GET HOME WITH HOSPICE, PALLIATIVE CARE AND SUPPORTIVE TRANSFUSIONS PRN, POSSIBLE ATTEMPT CHEMOTX DEPENDING ON PERFORMANCE STATUS AND PATIENT WISHES. DISCUSSED TODAY WITH DR Hilton SinclairWEITING.  PROLONGED DISCUSSION WITH PATIENT AND FAMILY MEMBER TODAY RE PLANS, PROGNOSIS WITHOUT AND WITH CHEMOTX, RISKS OF TX, OUTPATIENT SUPPORTIVE CARE AND TRANSFUSIONS   Electronic Signatures: Marin RobertsGittin, Lucill Mauck G (MD)  (Signed [phone]-Nov-13 22:13)  Authored: Chief Complaint, VITAL SIGNS/ANCILLARY NOTES, Brief Assessment, Lab Results, Assessment/Plan   Last Updated: 04-Nov-13 22:13 by Marin RobertsGittin, Rebbie Lauricella G (MD)

## 2014-10-02 NOTE — Consult Note (Signed)
Chief Complaint:   Subjective/Chief Complaint NO ACUTE COMPLAINTS, DENIES LEG PAIN AT THIS TIME, NO SOB, HAS BEEN OOB TO COMMODE   VITAL SIGNS/ANCILLARY NOTES: **Vital Signs.:   30-Oct-13 16:01   Vital Signs Type 1 hr Post Blood   Temperature Temperature (F) 97.9   Celsius 36.6   Temperature Source oral   Pulse Pulse 74   Respirations Respirations 18   Systolic BP Systolic BP 168   Diastolic BP (mmHg) Diastolic BP (mmHg) 74   Mean BP 105   Pulse Ox % Pulse Ox % 95   Oxygen Delivery Room Air/ 21 %   Brief Assessment:   Respiratory normal resp effort  clear BS    Gastrointestinal details normal Nontender    Additional Physical Exam PALLOR, ALERT AND COOPERATIVE, NEURO GROSSLY NON FOCAL, MOOD NORMAL, CALM   Lab Results: Routine Chem:  30-Oct-13 07:43    Result Comment platelet - VERIFIED BY SMEAR ESTIMATE  - Delta check investigation form completed.  - No pre-analytical or analytical error was  - detectable. The cause of the delta check is  - patient received plts 04/12/12 @10 :26pm  Result(s) reported on 13 Apr 2012 at 10:21AM.   Result Comment CALCIUM - RESULTS VERIFIED BY REPEAT TESTING.  - NOTIFIED OF CRITICAL VALUE  - CALLED RESULT TO Merry ProudBRANDI DAVENPORT  - AT [id number] 04/13/12-DAC  - READ-BACK PROCESS PERFORMED.  Result(s) reported on 13 Apr 2012 at 08:42AM.   Potassium, Serum 4.3 (Result(s) reported on 13 Apr 2012 at 08:34AM.)   Sodium, Serum 140 (Result(s) reported on 13 Apr 2012 at 08:34AM.)   Phosphorus, Serum 3.2 (Result(s) reported on 13 Apr 2012 at 08:34AM.)   Magnesium, Serum  2.5 (1.8-2.4 THERAPEUTIC RANGE: 4-7 mg/dL TOXIC: > 10 mg/dL  -----------------------)   Calcium (Total), Serum  6.8  Routine Hem:  30-Oct-13 07:43    WBC (CBC) 6.9   RBC (CBC)  1.99   Hemoglobin (CBC)  6.3   Hematocrit (CBC)  18.5   Platelet Count (CBC)  43   MCV 93   MCH 31.9   MCHC 34.3   RDW  18.4   Bands 5   Segmented Neutrophils 75   Lymphocytes 9   Monocytes 9    Metamyelocyte 2   Other Cells 100   NRBC 37   Diff Comment 1 ANISOCYTOSIS   Diff Comment 2 POIKILOCYTOSIS   Diff Comment 3 POLYCHROMASIA   Diff Comment 4 HYPOCHROMIA   Diff Comment 5 PLTS VARIED IN SIZE  Result(s) reported on 13 Apr 2012 at 10:21AM.   Assessment/Plan:  Assessment/Plan:   Assessment BLEEDING ULCER/MALIGNANT, ON XRT, MISSED TODAY DUR TO TRANSFUSION. 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 HGB DOWN TO 6.3 THIS AM. MELANA AND SOME CLOTS PASSED THIS AM. THROMBOCYTOPENIA, NO OTHER SITES OF BLEEDING    Plan RECHECK PLTS AND HGB, TRANSFUSE PRN, CONTINUE XRT, CONSIDER IF STABLE ENOUGH AND PERFORMANCE STATUS ADEQUATE TO ATTEMPT CHEMOTX LATER, POSSIBLY WITH PORT FOR ACCESS TO REPLACE GROIN LINE   Electronic Signatures: Marin RobertsGittin, Robert G (MD)  (Signed 30-Oct-13 18:13)  Authored: Chief Complaint, VITAL SIGNS/ANCILLARY NOTES, Brief Assessment, Lab Results, Assessment/Plan   Last Updated: 30-Oct-13 18:13 by Marin RobertsGittin, Robert G (MD)

## 2014-10-02 NOTE — Consult Note (Signed)
Chief Complaint:   Subjective/Chief Complaint events over the weekend noted-repeat hematemesis with a black stool on saturday.  not recurrent since.  denies n/v or abdominalpain.  no bm since saturday.   VITAL SIGNS/ANCILLARY NOTES: **Vital Signs.:   14-Oct-13 13:07   Vital Signs Type Routine   Temperature Temperature (F) 97.9   Celsius 36.6   Temperature Source Oral   Pulse Pulse 95   Respirations Respirations 20   Systolic BP Systolic BP 87   Diastolic BP (mmHg) Diastolic BP (mmHg) 55   Mean BP 65   Pulse Ox % Pulse Ox % 98   Pulse Ox Activity Level  At rest   Oxygen Delivery Room Air/ 21 %    14:06   Vital Signs Type Recheck   Pulse Pulse 96   Respirations Respirations 20   Systolic BP Systolic BP 98   Diastolic BP (mmHg) Diastolic BP (mmHg) 52   Mean BP 67   Brief Assessment:   Cardiac Regular    Respiratory normal resp effort    Gastrointestinal details normal Soft  Nontender  Nondistended  No masses palpable  Bowel sounds normal   Lab Results: Routine Chem:  20-BTD-97 41:63    Folic Acid, Serum 84.5 (Result(s) reported on 28 Mar 2012 at 08:18AM.)   Result Comment diff/nrbc - SLIDE PREVIOUSLY REVIEWED BY PATHOLOGIST  - mpg 03/28/12  Result(s) reported on 28 Mar 2012 at 05:20AM.   Glucose, Serum  105   BUN 9   Creatinine (comp)  0.46   Sodium, Serum 143   Potassium, Serum 3.8   Chloride, Serum  116   CO2, Serum  17   Calcium (Total), Serum  7.1   Anion Gap 10   Osmolality (calc) 284   eGFR (African American) >60   eGFR (Non-African American) >60 (eGFR values <50m/min/1.73 m2 may be an indication of chronic kidney disease (CKD). Calculated eGFR is useful in patients with stable renal function. The eGFR calculation will not be reliable in acutely ill patients when serum creatinine is changing rapidly. It is not useful in  patients on dialysis. The eGFR calculation may not be applicable to patients at the low and high extremes of body sizes,  pregnant women, and vegetarians.)  Routine Hem:  12-Oct-13 12:39    Hemoglobin (CBC)  6.3   Platelet Count (CBC)  54  13-Oct-13 02:16    Hemoglobin (CBC)  9.0   Platelet Count (CBC)  50    11:28    Hemoglobin (CBC)  8.6   Platelet Count (CBC)  46    23:38    Hemoglobin (CBC)  8.7   Platelet Count (CBC)  42  14-Oct-13 04:02    WBC (CBC) 5.6   RBC (CBC)  2.64   Hemoglobin (CBC)  8.2   Hematocrit (CBC)  23.3   Platelet Count (CBC)  44   MCV 88   MCH 31.0   MCHC 35.1   RDW  15.0   Bands 5   Segmented Neutrophils 68   Lymphocytes 18   Monocytes 3   Eosinophil 2   Metamyelocyte 3   NRBC 7   Diff Comment 1 ANISOCYTOSIS   Diff Comment 2 POLYCHROMASIA   Diff Comment 3 MICROCYTES PRESENT   Diff Comment 4 NORMAL PLT MORPHOLGY  Result(s) reported on 28 Mar 2012 at 05:20AM.   Assessment/Plan:  Assessment/Plan:   Assessment 1) upper GI bleed secondary to large duodenal ulcer. recurrent 2 days ago, not since. 2) platelet count less than  last week, poor recovery. question of possible bm problems.  Appreciate Hematology/Oncology assistance.    Plan 1) continue iv ppi as you are, octreotide noted, may be of benefits. Appreciate surgery assistance/following.  Will discuss further with Oncology.   Electronic Signatures: Loistine Simas (MD)  (Signed 14-Oct-13 16:39)  Authored: Chief Complaint, VITAL SIGNS/ANCILLARY NOTES, Brief Assessment, Lab Results, Assessment/Plan   Last Updated: 14-Oct-13 16:39 by Loistine Simas (MD)

## 2014-10-02 NOTE — Consult Note (Signed)
Chief Complaint:   Subjective/Chief Complaint Covering for Dr. Marva PandaSkulskie. No BM since Sat. Feels weak. Bone marrow bx pending. Diet just advanced.   VITAL SIGNS/ANCILLARY NOTES: **Vital Signs.:   18-Oct-13 14:13   Vital Signs Type Blood Transfusion   Brief Assessment:   Cardiac Regular    Respiratory clear BS    Gastrointestinal Normal   Lab Results: Routine Chem:  18-Oct-13 04:00    Potassium, Serum 3.8 (Result(s) reported on 01 Apr 2012 at [facility]06:20AM.)   Result Comment LABS - This specimen was collected through an   - indwelling catheter or arterial line.  - A minimum of 5mls of blood was wasted prior    - to collecting the sample.  Interpret  - results with caution. platelet count - VERIFIED BY SMEAR ESTIMATE diff/nrbc - SLIDE PREVIOUSLY REVIEWED BY PATHOLOGIST  - mpg 04/01/12  Result(s) reported on 01 Apr 2012 at [facility]06:20AM.  Routine Hem:  18-Oct-13 04:00    WBC (CBC) 5.1   RBC (CBC)  2.78   Hemoglobin (CBC)  8.4   Hematocrit (CBC)  24.1   Platelet Count (CBC)  35   MCV 87   MCH 30.2   MCHC 34.9   RDW  16.0   Bands 9   Segmented Neutrophils 61   Lymphocytes 8   Variant Lymphocytes 1   Monocytes 7   Eosinophil 3   Metamyelocyte 10   Myelocyte 1   NRBC 9   Diff Comment 1 ANISOCYTOSIS   Diff Comment 2 POIKILOCYTOSIS   Diff Comment 3 POLYCHROMASIA   Diff Comment 4 MICROCYTES PRESENT   Diff Comment 5 PLTS VARIED IN SIZE  Result(s) reported on 01 Apr 2012 at 06:20AM.   Assessment/Plan:  Assessment/Plan:   Assessment Duodenal ulcers. On PPI.    Plan Agree with diet advancement. Will see how she does.Thanks   Electronic Signatures: Lutricia Feilh, Elisabetta Mishra (MD)  (Signed 18-Oct-13 14:08)  Authored: Chief Complaint, VITAL SIGNS/ANCILLARY NOTES, Brief Assessment, Lab Results, Assessment/Plan   Last Updated: 18-Oct-13 14:08 by Lutricia Feilh, Zilah Villaflor (MD)

## 2014-10-02 NOTE — Consult Note (Signed)
PATIENT NAME:  Katie Gilmore MR#:  [id number] DATE OF BIRTH:  [birth year]  DATE OF CONSULTATION:  03/22/2012  REFERRING PHYSICIAN:  Katharina Caper, MD CONSULTING PHYSICIAN:  Christena Deem, MD  REASON FOR CONSULTATION: Melena.   HISTORY OF PRESENT ILLNESS: Katie Gilmore is a 69 year old Caucasian female who presented to the Emergency Room yesterday with leg pain. Katie Gilmore was found to be quite anemic on evaluation with a complaint of some black stools this past Sunday. Katie Gilmore had gone to a walk-in clinic with complaint of right leg pain and was given some Mobic to use. Katie Gilmore apparently was on a similar medication from her outpatient physician as well but was unable get back in touch with him. Katie Gilmore had taken this for several days. Katie Gilmore then noticed some black stools this past Sunday. Katie Gilmore states that Katie Gilmore has not seen any black stools since Sunday. However, when Katie Gilmore came back to the walk-in clinic their assessment took her onto the Emergency Room and they found her to be anemic. Katie Gilmore has been having problems with nausea as well as vomiting intermittently that may last for a day or two for about a year. There has been no heartburn or dysphagia. There is some epigastric pain with a burning sensation as well as bloating. Katie Gilmore does take an 81 mg aspirin on a regular daily basis. Katie Gilmore denies use of any other NSAIDs with the exception of that noted above. Katie Gilmore has frequent problems with constipation, perhaps one bowel movement a week. Katie Gilmore does occasionally take MiraLax to help her. Katie Gilmore states that the only episode of black stool Katie Gilmore has seen was this past Sunday, two days ago. There has been no bright red rectal bleeding or slimy material in the stools. Katie Gilmore has never had a colonoscopy in the past.   PAST MEDICAL HISTORY:  1. History of seizure disorder.  2. Hyperlipidemia.  3. Hysterectomy.  4. Appendectomy.  5. Cholecystectomy.  6. History of osteoporosis.   ALLERGIES: Katie Gilmore is allergic to Depakote, Dilantin,  metronidazole, and Tegretol.   OUTPATIENT MEDICATIONS:  1. Acetaminophen/hydrocodone 325/5 mg. 2. Aspirin 81 mg aspirin. 3. Keppra 750 mg 2 tablets once a day in the morning, Katie Gilmore also takes 1/2 tablet at noon and 2-1/2 tablets at night.  4. Topamax 100 mg once a day at bedtime.  5. Topamax 25 mg three in the morning.   SOCIAL HISTORY: The patient is a smoker. Katie Gilmore does not use alcohol. Katie Gilmore is widowed and lives by herself.   REVIEW OF SYSTEMS: Per admission history and physical, it is of note that Katie Gilmore has had about a 10 pound weight loss over the past two months or so, some intermittent sinus problems, and some dyspnea on exertion. Please note review of systems in admission history and physical.   PHYSICAL EXAMINATION:   VITAL SIGNS: Temperature 98.2, pulse 83, respirations 18, and blood pressure 98/55.   GENERAL: Katie Gilmore is thin 69 year old Caucasian female, in no acute distress.   HEAD: Normocephalic, atraumatic.   EYES: Anicteric.   NOSE: Septum midline. No lesions.   OROPHARYNX: No lesions.   NECK: Supple. No JVD. No lymphadenopathy. No thyromegaly.   HEART: Regular rate and rhythm.   LUNGS: Clear.   ABDOMEN: Soft. Katie Gilmore is tender to palpation throughout the epigastric region extending toward the left upper quadrant. There are no masses, rebound, or organomegaly. Bowel sounds positive, normoactive.   RECTAL: Anorectal exam is deferred.   EXTREMITIES: No clubbing, cyanosis, or edema.   NEUROLOGICAL: Cranial nerves II  through XII grossly intact. Muscle strength bilaterally equal and symmetric. Deep tendon reflexes bilaterally equal and symmetric.   LABS/RADIOLOGIC STUDIES: On admission to the hospital yesterday Katie Gilmore had a glucose of 104, BUN 11, creatinine 0.66, sodium 135, potassium 3.6, chloride 103, bicarbonate 21, and calcium 7.6. Hepatic profile shows a total protein of 6.2, albumin 33, total bilirubin 0.3, alkaline phosphatase 658, AST 36, and ALT 18. Her hemogram shows a  white count of 8.3, hemoglobin and hematocrit 7.3/21.1, and platelet count 109. Her MCV is 87. Katie Gilmore has subsequently been transfused 2 units of blood. This morning her hemoglobin is 9.4 and platelet count 81. Alkaline phosphatase this morning was 569.  Katie Gilmore had a urinalysis on admission showing 1+ blood, negative nitrite, leukocyte esterase 2+, and WBCs 18 per high-power field.   Katie Gilmore had a color Doppler of the lower right extremity because of her leg pain, negative for deep vein thrombosis.   Katie Gilmore also had a portable chest film showing mild hyperinflation, correlate for chronic obstructive pulmonary disease.   ASSESSMENT:  1. Melena/black stools and anemia in the setting of NSAID use. Of concern is the period of time Katie Gilmore has been having problems with nausea, vomiting, and epigastric pain. Further is her recent weight loss.  2. Abnormal alkaline phosphatase, normal liver associated enzymes otherwise, note low albumin.   RECOMMENDATIONS:  1. EGD. I have discussed the risks, benefits, and complications of EGD to include but not limited to bleeding, infection, perforation, and the risk of sedation, and Katie Gilmore wishes to proceed.         2. It is of note that Katie Gilmore also has some thrombocytopenia in the setting of her leg pain. Would consider further evaluation via oncology.  3. Continue IV PPI as you are.  ____________________________ Christena DeemMartin U. Allison Silva, MD mus:slb D: 03/22/2012 08:02:32 ET T: 03/22/2012 08:24:56 ET JOB#: [id number]  cc: Christena DeemMartin U. Nichele Slawson, MD, <Dictator> Dennison MascotLemont Morrisey, MD Christena DeemMARTIN U Yoali Conry MD ELECTRONICALLY SIGNED 04/14/2012 11:22

## 2014-10-02 NOTE — Consult Note (Signed)
Chief Complaint:   Subjective/Chief Complaint Increasing abd pain after ingesting K tablet. Just vomited blood. Drop in hgb and pltc. LFT going  higher.   VITAL SIGNS/ANCILLARY NOTES: **Vital Signs.:   19-Oct-13 08:59   Temperature Temperature (F) 97.5   Celsius 36.3   Pulse Pulse 86   Respirations Respirations 18   Systolic BP Systolic BP 035   Diastolic BP (mmHg) Diastolic BP (mmHg) 73   Mean BP 95   Pulse Ox % Pulse Ox % 97   Oxygen Delivery Room Air/ 21 %   Brief Assessment:   Cardiac Regular    Respiratory clear BS    Gastrointestinal Normal   Lab Results: Hepatic:  18-Oct-13 16:08    Bilirubin, Total  1.9   Alkaline Phosphatase  816   SGPT (ALT) 19   SGOT (AST) 34   Total Protein, Serum  5.4   Albumin, Serum  2.9  Routine BB:  18-Oct-13 16:08    Direct Coombs, Polyspecific Negative (Result(s) reported on 01 Apr 2012 at 05:09PM.)  General Ref:  18-Oct-13 16:08    Haptoglobin ========== TEST NAME ==========  ========= RESULTS =========  = REFERENCE RANGE =  HAPTOGLOBIN  Haptoglobin Haptoglobin                     [L  13 mg/dL             ]            [address]               [facility]            No: [id number]           [address] [place], [place] [address]           Lindon Romp, MD         [phone]   Result(s) reported on 02 Apr 2012 at 04:17AM.  Routine Chem:  18-Oct-13 16:08    Result Comment CBC/NRBC - SLIDE PREVIOUSLY REVIEWED BY PATHOLOGIST  Result(s) reported on 01 Apr 2012 at 05:55PM.   Glucose, Serum  114   BUN 8   Creatinine (comp)  0.46   Sodium, Serum 142   Potassium, Serum 3.7   Chloride, Serum  112   CO2, Serum 21   Calcium (Total), Serum  8.1   Anion Gap 9   Osmolality (calc) 282   eGFR (African American) >60   eGFR (Non-African American) >60 (eGFR values <47m/min/1.73 m2 may be an indication of chronic kidney disease (CKD). Calculated eGFR is useful in patients with stable renal function. The eGFR  calculation will not be reliable in acutely ill patients when serum creatinine is changing rapidly. It is not useful in  patients on dialysis. The eGFR calculation may not be applicable to patients at the low and high extremes of body sizes, pregnant women, and vegetarians.)   LDH, Serum  593 (Result(s) reported on 01 Apr 2012 at 04:53PM.)  19-Oct-13 04:41    Result Comment CBC - SLIDE PREVIOUSLY REVIEWED BY PATHOLOGIST WBC - VERIFIED BY SMEAR ESTIMATE LABS - This specimen was collected through an   - indwelling catheter or arterial line.  - A minimum of 570m of blood was wasted prior    - to collecting the sample. Interpret  - results with caution.  Result(s) reported on 02 Apr 2012 at 06:32AM.   Glucose, Serum 96   BUN 10   Creatinine (comp)  0.58  Sodium, Serum 142   Potassium, Serum 3.8   Chloride, Serum  114   CO2, Serum  19   Calcium (Total), Serum  7.4   Anion Gap 9   Osmolality (calc) 282   eGFR (African American) >60   eGFR (Non-African American) >60 (eGFR values <101m/min/1.73 m2 may be an indication of chronic kidney disease (CKD). Calculated eGFR is useful in patients with stable renal function. The eGFR calculation will not be reliable in acutely ill patients when serum creatinine is changing rapidly. It is not useful in  patients on dialysis. The eGFR calculation may not be applicable to patients at the low and high extremes of body sizes, pregnant women, and vegetarians.)  Routine Hem:  18-Oct-13 16:08    WBC (CBC) 4.2   RBC (CBC)  2.40   Hemoglobin (CBC)  7.6   Hematocrit (CBC)  20.9   Platelet Count (CBC)  66   MCV 87   MCH 31.5   MCHC  36.3   RDW  16.0   Bands 9.   Segmented Neutrophils 59   Lymphocytes 20   Monocytes 7   Metamyelocyte 2   Myelocyte 1   NRBC 8   Diff Comment 1 ANISOCYTOSIS   Diff Comment 2 POLYCHROMASIA   Diff Comment 3 PLTS VARIED IN SIZE  Result(s) reported on 01 Apr 2012 at 05:55PM.   Eosinophil 2   Basophil 0  19-Oct-13  04:41    WBC (CBC) 4.3   RBC (CBC)  1.83   Hemoglobin (CBC)  5.6   Hematocrit (CBC)  16.2   Platelet Count (CBC)  38   MCV 89   MCH 30.5   MCHC 34.5   RDW  16.4   Bands 18   Segmented Neutrophils 61   Lymphocytes 7   Monocytes 3   Metamyelocyte 10   Myelocyte 1   NRBC 13   Diff Comment 1 ANISOCYTOSIS   Diff Comment 2 POLYCHROMASIA   Diff Comment 3 MACROCYTES PRESENT   Diff Comment 4 SCHISTOCYTES   Diff Comment 5 PLTS VARIED IN SIZE  Result(s) reported on 02 Apr 2012 at 06:32AM.   Assessment/Plan:  Assessment/Plan:   Assessment Duodenal ulcer with metastatic bone disease.    Plan Switch back to liquid diet with hematemesis and dropping pltc. Protonix drip for now. Blood transfusions ordered. If further bleeding, make patient npo. Switch KCL to iv form if necessary.   Electronic Signatures: OVerdie Shire(MD)  (Signed 19-Oct-13 09:28)  Authored: Chief Complaint, VITAL SIGNS/ANCILLARY NOTES, Brief Assessment, Lab Results, Assessment/Plan   Last Updated: 19-Oct-13 09:28 by OVerdie Shire(MD)

## 2014-10-02 NOTE — Op Note (Signed)
PATIENT NAME:  Katie Gilmore, HELDT MR#:  [id number] DATE OF BIRTH:  [birth year]  DATE OF PROCEDURE:  04/05/2012  PREOPERATIVE DIAGNOSES:  1. Upper GI bleed.  2. Anemia of blood loss with a hemoglobin of 4.8.  3. Adenocarcinoma, metastatic.  4. Large duodenal ulceration as the source of the bleeding.   POSTOPERATIVE DIAGNOSES:  1. Upper GI bleed.  2. Anemia of blood loss with a hemoglobin of 4.8.  3. Adenocarcinoma, metastatic.  4. Large duodenal ulceration as the source of the bleeding.   PROCEDURES PERFORMED:  1. Selective angiography of the gastroduodenal artery.  2. Coil embolization of the gastroduodenal artery.   PROCEDURE PERFORMED BY: Renford Dills, MD  SEDATION: Versed 4 mg plus fentanyl 150 mcg administered IV. Continuous ECG, pulse oximetry and cardiopulmonary monitoring was performed throughout the entire procedure by the interventional radiology nurse. Total sedation time was approximately 1 hour, 40 minutes.   CONTRAST: Isovue 55 mL.   FLUORO TIME: 34.2 minutes.   INDICATIONS: Katie Gilmore is a 69 year old woman with a very complicated medical problem who presented to the hospital with upper GI bleed. Work-up demonstrated a large duodenal ulcer which is highly suspicious for malignancy. Subsequently, she has been found to have multiple foci of adenocarcinoma, primary yet unknown but suspicious for GI tract. She has had significant bleeding from this ulcer and is now undergoing attempts at coil embolization to reduce and/or stop the bleeding. The risks, benefits were reviewed in detail with the patient including the family, all questions are answered, patient has agreed to proceed.   DESCRIPTION OF PROCEDURE: Patient is taken to special procedures, placed in supine position. After adequate sedation is achieved, the left groin is prepped and draped in a sterile fashion. Ultrasound is placed in a sterile sleeve. Ultrasound is utilized secondary to lack of appropriate landmarks  and to avoid vascular injury. Under direct ultrasound visualization, common femoral artery is identified. It is echolucent, homogeneous, and easily compressible indicating patency. Image is recorded for the permanent record. Micropuncture needle is then used to under direct ultrasound visualization to access the anterior wall of the common femoral artery, microwire followed by micro sheath, J-wire followed by 5 French sheath and 5 French pigtail catheter. Pigtail catheter is positioned at the level of T10-T11 and AP projection is performed. Celiac and mesenteric arteries are identified. Gastroduodenal is visualized.   The image intensifier is then repositioned in the lateral plane and using a combination of a 6 [place] guiding catheter first and [facility] but then a LIMA and a VS-1 and then a VS-2 catheter along with the Glidewire. The system is advanced into the common hepatic and then seated in the proximal gastroduodenal. Selective images are then obtained. It should be noted that the 5 French sheath was exchanged for a 6 [place] sheath.   The Magic torque wire is then advanced down to the gastroepiploic and a straight catheter is then advanced over the wire. 0.035 coils 6 diameter x 14 in length are then deployed beginning at the origin of the gastroepiploic and extending backward into the gastroduodenal. Two 6 x 14 are used. Subsequently a prograde catheter is introduced. Selective imaging of branches performed and a 3 x 2 coil is placed in this branch then a total of 1 mL of medium-sized 5 to 700 micron PVC beads is introduced into the gastroduodenal artery which is preferentially filling the duodenal branches at this time. The prograde catheter is then backed up slightly as is the straight catheter. The  guiding sheath is positioned in the common hepatic and a series of three more coils 0.018 6 x 6 coils are placed, also a 0.018 3 x 2 coil is further placed. Final imaging is then obtained through the guiding  sheath demonstrating that the coil comes to within 2 mm of the origin of the gastroduodenal, hepatic is widely patent. There is now no longer flow through the gastroduodenal.   The system is then removed over a wire. Wire is pulled back into the aorta. Oblique view of the left groin is obtained and StarClose device is deployed. There is some skin edge bleeding and 10 mL of lidocaine with epinephrine is infiltrated into the soft tissues to help with hemostasis and a safeguard is applied. The patient tolerated this procedure quite well. There were no episodes of hemodynamic instability and she maintained her pressure and oxygen saturations throughout the entire course. She is taken to the recovery room in stable condition.   INTERPRETATION: Initial views demonstrate the celiac access. Imaging is obtained of the gastroduodenal selectively with extension down to the gastroepiploic. Multiple smaller duodenal branches are noted. There does not appear to be any collateral filling to the SMA directly.   Coils are then placed across the entire length of the gastroduodenal extending down to the approximate origin of the gastroepiploic.   SUMMARY: Successful coil embolization with bead embolization of the selective duodenal branches as described above.   ____________________________ Renford DillsGregory G. Kahlin Mark, MD ggs:cms D: 04/06/2012 09:47:00 ET T: 04/06/2012 10:00:07 ET JOB#: [id number]  cc: Renford DillsGregory G. Zakaree Mcclenahan, MD, <Dictator> Claude MangesWilliam F. Marterre, MD Christena DeemMartin U. Skulskie, MD Knute Neuobert G. Lorre NickGittin, MD Dennison MascotLemont Morrisey, MD Renford DillsGREGORY G Tramel Westbrook MD ELECTRONICALLY SIGNED 04/26/2012 12:13

## 2014-10-02 NOTE — Consult Note (Signed)
Chief Complaint:   Subjective/Chief Complaint mild nausea with episode of small amount of emesis after radiation tx today.  dark bm yesterday.  no abdominal pain trying to eat full liquids.   VITAL SIGNS/ANCILLARY NOTES: **Vital Signs.:   31-Oct-13 10:00   Vital Signs Type Routine   Temperature Temperature (F) 98.6   Celsius 37   Temperature Source oral   Pulse Pulse 79   Respirations Respirations 18   Systolic BP Systolic BP 176   Diastolic BP (mmHg) Diastolic BP (mmHg) 81   Mean BP 112   Pulse Ox % Pulse Ox % 96   Pulse Ox Activity Level  At rest   Oxygen Delivery Room Air/ 21 %  *Intake and Output.:   31-Oct-13 13:54   Stool  loose, small amt w/urine in bedside commode   Brief Assessment:   Cardiac Regular    Respiratory clear BS    Gastrointestinal details normal Soft  Nontender  Nondistended  No masses palpable  Bowel sounds normal   Lab Results: Hepatic:  31-Oct-13 05:58    Albumin, Serum  2.5 (Result(s) reported on 14 Apr 2012 at 06:33AM.)  Routine Chem:  31-Oct-13 05:58    Result Comment WBC - RESULT CORRECTED FOR NUCLEATED RBCS NRBC - SLIDE PREVIOUSLY REVIEWED BY PATHOLOGIST PLATELET - RESULTS VERIFIED BY REPEAT TESTING.  Result(s) reported on 14 Apr 2012 at 07:40AM.   Result Comment WBC - RESULT CORRECTED FOR NUCLEATED RBCS NRBC - SLIDE PREVIOUSLY REVIEWED BY PATHOLOGIST PLatelet - Delta check investigation form completed.  - No pre-analytical or analytical error was  - detectable. The cause of the delta check is  - patientwas transfused 1 unit platelet  - pheresis [id number] at 0309. DLQ  Result(s) reported on 14 Apr 2012 at 06:33AM.   Magnesium, Serum  2.7 (1.8-2.4 THERAPEUTIC RANGE: 4-7 mg/dL TOXIC: > 10 mg/dL  -----------------------)   Calcium (Total), Serum  7.2 (Result(s) reported on 14 Apr 2012 at 06:33AM.)   Potassium, Serum 4.7 (Result(s) reported on 14 Apr 2012 at 06:33AM.)   Phosphorus, Serum 3.2 (Result(s) reported on 14 Apr 2012 at 06:33AM.)    Sodium, Serum 142 (Result(s) reported on 14 Apr 2012 at 06:33AM.)  Routine Hem:  31-Oct-13 05:58    WBC (CBC) 7.2   RBC (CBC)  2.65   Hemoglobin (CBC)  7.9   Hematocrit (CBC)  24.5   Platelet Count (CBC)  75   MCV 93   MCH 29.8   MCHC 32.2   RDW  16.2   Bands 6   Segmented Neutrophils 71   Lymphocytes 10   Monocytes 9   Metamyelocyte 4   NRBC 71   Diff Comment 1 ANISOCYTOSIS   Diff Comment 2 POLYCHROMASIA   Diff Comment 3 POIKILOCYTOSIS   Diff Comment 4 SCHISTOCYTES   Diff Comment 5 BASOPHILIC STIPPLING  Result(s) reported on 14 Apr 2012 at 07:40AM.   Assessment/Plan:  Assessment/Plan:   Assessment 1) duodenal ulcer, possible primary for metastatic adenocarcimoma.  Now undergoing radiation tx.  continueing on bid ppi.  2) thrombocytopenia-somewhat more approriate increase after tfx last night.    Plan 1) no new Gi recs, continue current.   Electronic Signatures: Barnetta ChapelSkulskie, Apollo Timothy (MD)  (Signed 31-Oct-13 14:17)  Authored: Chief Complaint, VITAL SIGNS/ANCILLARY NOTES, Brief Assessment, Lab Results, Assessment/Plan   Last Updated: 31-Oct-13 14:17 by Barnetta ChapelSkulskie, Maddix Kliewer (MD)

## 2014-10-02 NOTE — Consult Note (Signed)
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   Referred by Dr. Grayland Ormond.  Diagnosis:   Chief Complaint/Diagnosis   69 year old female with active duodenal believe from resumed primary carcinoma with widespread metastatic disease to skeletal sites with thrombocytopenia secondary to active tumor bleed plus invasion of bone with adenocarcinoma   Pathology Report Pathology report reviewed    Imaging Report PET/CT and CT scans reviewed    Referral Report Clinical notes reviewed    Planned Treatment Regimen Palliative radiation therapy to duodenal ulcer    HPI   patient is an interesting 69 year old female presented to the hospital increasing lower extremity pain on October 7. She's also been dark stools at that time and progressive thrombocytopenia. She's had about a 15 pound weight loss over the past several months. Upper endoscopy was performed showing a large duodenal ulcer actively bleeding. Went on to have a bone scanshowing widespread metastatic disease.CT scan demonstrated bilateral pleural effusions.bone marrow biopsy confirmed adenocarcinoma consistent with GI origin. She has had embolization to try to attempt to eradicate the bleeding the duodenal site which has failed. She continues to be transfusion dependent and platelet transfusion dependent. Her case was presented at our weekly tumor Board. Suggestion was made to go ahead with palliative radiotherapy to the duodenal site and then proceed with systemic chemotherapy. She is seen today in her hospital suite. She is accompanied by multiple family members. She is in no significant pain at this time. She had a transfusion last night. I've asked to evaluate her today for possible palliative radiation therapy to her duodenal site.  Past Hx:    Seizures:    Appendectomy: Oct 1965   Cholecystectomy: Oct 1987   hysterectomy:   Past,  Family and Social History:   Past Medical History positive    Cardiovascular hyperlipidemia    Neurological/Psychiatric History of seizures    Past Surgical History appendectomy; cholecystectomy; Hysterectomy    Past Medical History Comments Osteoporosis    Family History noncontributory    Social History positive    Social History Comments 20-pack-year smoking history no EtOH abuse history    Additional Past Medical and Surgical History Accompanied by multiple family members today   Allergies:   Tegretol: Rash  Depakote: Seizures  Dilantin: Seizures  Metronidazole: Seizures  Home Meds:  Home Medications: Medication Instructions Status  Keppra tablet 750 mg 2 tab(s)  once a day in the morning     patient takes 1/2 tab at noon and 2 1/2 tabs at night  Active  Topamax 100 mg oral tablet 1 tab(s) orally once a day (at bedtime) Active  acetaminophen-hydrocodone 300 mg-5 mg oral tablet 1 tab(s) orally every 8 hours, As Needed- for Pain  Active  Topamax 25 mg oral tablet 3 tab(s) orally once a day in the morning  2m in the am          Active  aspirin 81 mg oral tablet 1 tab(s) orally once a day Active  Topamax 25 mg oral tablet 3 tab(s) orally once a day (in the morning) Active   Review of Systems:   General negative    Performance Status (ECOG) 1    Skin negative    Breast negative    Ophthalmologic negative    ENMT negative    Respiratory and Thorax negative    Cardiovascular negative    Gastrointestinal see HPI  Genitourinary negative    Musculoskeletal negative    Neurological see HPI    Psychiatric negative    Hematology/Lymphatics negative    Endocrine negative    Allergic/Immunologic negative    Review of Systems   Patient denies any weight loss, fatigue, weakness, fever, chills or night sweats. Patient denies any loss of vision, blurred vision. Patient denies any ringing  of the ears or hearing loss. No irregular heartbeat. Patient denies  heart murmur or history of fainting. Patient denies any chest pain or pain radiating to her upper extremities. Patient denies any shortness of breath, difficulty breathing at night, cough or hemoptysis. Patient denies any swelling in the lower legs. Patient denies any nausea vomiting, vomiting of blood, or coffee ground material in the vomitus. Patient denies any stomach pain. Patient states has had normal bowel movements no significant constipation or diarrhea. Patient denies any dysuria, hematuria or significant nocturia. Patient denies any problems walking, swelling in the joints or loss of balance. Patient denies any skin changes, loss of hair or loss of weight. Patient denies any excessive worrying or anxiety or significant depression. Patient denies any problems with insomnia. Patient denies excessive thirst, polyuria, polydipsia. Patient denies any swollen glands, patient denies easy bruising or easy bleeding. Patient denies any recent infections, allergies or URI. Patient "s visual fields have not changed significantly in recent time.  Physical Exam:  General/Skin/HEENT:   General normal    Skin normal    Eyes normal    ENMT normal    Head and Neck normal    Additional PE Well-developed female in NAD. Lungs are clear to A&P cardiac examination shows regular rate and rhythm. She has peripheral swelling in her upper and lower extremities 2+. Abdomen is benign. No organomegaly or masses are noted. No pain is elicited on deep palpation of her abdomen or spine. Motor sensory and DTR levels appear equal and symmetric in the upper and lower extremities.   Breasts/Resp/CV/GI/GU:   Respiratory and Thorax normal    Cardiovascular normal    Gastrointestinal normal    Genitourinary normal   MS/Neuro/Psych/Lymph:   Musculoskeletal normal    Neurological normal    Lymphatics normal   Assessment and Plan:  Impression:   widespread metastatic adenocarcinoma GI origin with bleeding at to  duodenal ulcer site in patient who is develop cervix significant anemia transfusion dependent with thrombocytopenia.  Plan:   based on respirations may by her weekly to her tumor conference. Would like to go ahead with a course of palliative radiation therapy to her duodenal region. Would plan on delivering 3000 cGy over 2 weeks and evaluate for response. Risks and benefits of treatment including possible diarrhea, possible nausea, damage to normal bowel, skin reaction were all discussed in detail with the patient and her family. I have set her up on a semi-emergent basis for simulation tomorrow and will like to start her first treatments first day of next week. CT simulation has been arranged.  I would like to take this opportunity to thank you for allowing me to continue to participate in this patient's care.  Electronic Signatures: Carmeline Kowal, Roda Shutters (MD)  (Signed 24-Oct-13 14:12)  Authored: HPI, Diagnosis, Past Hx, PFSH, Allergies, Home Meds, ROS, Physical Exam, Encounter Assessment and Plan   Last Updated: 24-Oct-13 14:12 by Armstead Peaks (MD)

## 2014-10-02 NOTE — H&P (Signed)
PATIENT NAME:  Katie Gilmore, Katie Gilmore MR#:  [id number] DATE OF BIRTH:  1945-12-12  DATE OF ADMISSION:  03/21/2012  PRIMARY CARE PHYSICIAN: Dennison Mascot, MD    HISTORY OF PRESENT ILLNESS: The patient is a 69 year old Caucasian female with past medical history significant for history of recent problems with right lower extremity pain, presented to the hospital with complaints of not feeling well. According to the patient, she was coming to the [facility] to have her leg checked again. She has been having increasing pains in the right lower extremity. It started approximately one week ago. She was seen at a walk-in clinic as well as by Dr. Thana Ates, and now she was about to go to a walk-in clinic again; however, because of them noticing the patient being very anemic, the patient was sent to the hospital for further evaluation. The patient is complaining of pain in the ankle, calf, as well as the knee area. It comes and goes kind of pain mostly in the nighttime. She was started on medications for her pains with Mobic approximately a week ago. The patient has been taking Mobic twice a day apart from her aspirin, which she is chronically. Approximately three days ago she started noticing nausea as well as gagging. She was spitting up some phlegm, however, denies any hematemesis. She started noticing black stool yesterday in the morning. She was having bowel movements every one hour, black stool, however, no fresh blood was noted. She has been also complaining of stomach pains in upper abdomen for a couple of days. The pain is described as burning pain and sensation intermittent with no significant change. Whenever she tries to eat or drink, however, the patient is not able to eat much. In fact, over the past two days she has not been eating very much and has not been able to drink much. She has been losing weight.  She lost approximately 10 pounds over the past 2 or 3 months now, however, admits that her oral  intake has not been satisfactory over the past few months. She has been also chilly, and fatigued and weak. Because of the weight loss, pain, as well as feeling fatigued and weak, as well as having black stools, she  decided to come to the Emergency Room for further evaluation. In the Emergency Room, she was noted to be anemic with hemoglobin level of 7.3, and Hospitalist Services were contacted for admission.   PAST MEDICAL/SURGICAL HISTORY:  1. History of seizure disorder.  2. Hyperlipidemia,  3. Hysterectomy.  4. Appendectomy.  5. Cholecystectomy.  6. Osteoporosis, intolerance of two GI medications, and she has been on Reclast in the past.   CURRENT MEDICATIONS:  1. Acetaminophen/hydrocodone 325/5 mg, 1 tablet every 8 hours as needed.  2. Aspirin 81 mg p.o. daily.  3. Cyclobenzaprine 5 mg p.o. at bedtime. 4. Keppra 1500 mg b.i.d.  5. Mobic 7.5 mg p.o. b.i.d. 6. Topamax 100 mg at bedtime and 75 mg in the morning.   ALLERGIES: The patient is allergic to Tegretol, Dilantin, Depakote as well as metronidazole.   REVIEW OF SYSTEMS: As above. The patient is complaining of chills, fatigue and weakness, pains, weight loss--10 pounds in the past 2 or 3 months, also sinus congestion, some nausea and gagging, also black tarry-looking stools and abdominal pain in the upper abdomen. Also, intermittent dyspnea on exertion. CONSTITUTIONAL: The patient denies any high fevers, weight gain. EYES: In regards to eyes, denies any blurry vision, double vision, glaucoma, or cataracts. ENT:  Denies any tinnitus, allergies, epistaxis, sinus pain, dentures, difficulty swallowing. RESPIRATORY: Denies any cough, wheezes, asthma, or chronic obstructive pulmonary disease. CARDIOVASCULAR: Denies any chest pains, orthopnea, arrhythmias, palpitations or syncope. GASTROINTESTINAL: Denies any vomiting, hematemesis. Admits melena. Denies any jaundice. Denies any change in bowel habits. GENITOURINARY: Denies dysuria, hematuria,  frequency, or incontinence. ENDOCRINOLOGY: Denies polydipsia, nocturia, thyroid problems, heat or cold intolerance or thirst. HEMATOLOGIC: Denies anemia, easy bruising, bleeding, or swollen glands. SKIN: Denies any acne, rashes, lesions, or change in moles. MUSCULOSKELETAL: Denies arthritis, cramps, swelling. NEUROLOGIC: Denies numbness, epilepsy, or tremor. Admits of right lower extremity pains.    FAMILY HISTORY: The patient's mother died at the age of 69 of unknown reason. No cancer, diabetes, early coronary artery disease or cerebrovascular accidents were noted.   SOCIAL HISTORY: The patient is widowed, lives alone. She smokes 1/2 pack per day for the past 20 years. She doesn't drink any alcohol. She used to work in a mill.   PHYSICAL EXAMINATION:  VITAL SIGNS: On arrival to the hospital, temperature is 97, pulse 102, respirations 16, blood pressure 132/58, saturation 100% on room air.   GENERAL: This is well-developed, well-nourished Caucasian female, thin and even cachectic-looking, sitting on a stretcher. She is pale.   HEENT: Her eyes are somewhat sunken and has bluish discoloration under her eyes. Pupils are equal and reactive to light. Extraocular movements are intact. No icterus or conjunctivitis. Normal hearing. No pharyngeal erythema. Mucosa is dry.  NECK: No masses, supple and nontender. Thyroid not enlarged. No adenopathy. No JVD.  Full range of motion.   LUNGS: Clear to auscultation but diminished breath sounds bilaterally. No rales, rhonchi or wheezing. No labored inspirations, increased effort, dullness to percussion or overt respiratory distress.   CARDIOVASCULAR: S1, S2 appreciated. No murmurs, gallops, or rubs noted. PMI is not enlarged. Chest is nontender to palpation, 1+ pedal pulses.    EXTREMITIES: No lower extremity edema, calf tenderness or cyanosis was noted.    ABDOMEN: Soft, tender in the epigastrium as well as right and left upper quadrants. No rebound or guarding  were noted. No hepatosplenomegaly or masses were noted.    MUSCULOSKELETAL: Muscle strength: Able to move all extremities. No cyanosis, degenerative joint disease or kyphosis.  Gait is not tested.   SKIN: Skin did not reveal any rashes, lesions, erythema, nodularity, induration. It was warm and dry to palpation.   LYMPH: No adenopathy in the cervical region.   NEUROLOGICAL: Cranial nerves are grossly intact. Sensory is intact. No dysarthria or aphasia.   PSYCHIATRIC: The patient is alert, oriented to time, person, place, cooperative. Memory is good. No significant confusion, agitation, depression.   LABORATORY, DIAGNOSTIC AND RADIOLOGICAL DATA: EKG is not done. No radiologic studies are done as well. BMP showed glucose of 104, sodium 135, calcium 7.6, albumin level 3.3, alkaline phosphatase 658, otherwise unremarkable study. CBC: White blood cell count 8.3, hemoglobin 7.3, platelet count 109. Coagulation panel: Pro time 15.5, INR 1.1, activated PTT 31.3.  ASSESSMENT AND PLAN:  1. Anemia of acute gastrointestinal bleed: Admit the patient to a Medical floor. The patient is being transfused 2 units already in the Emergency Room. We will follow hemoglobin level after transfusion and will get gastroenterologist for further evaluation. We will transfuse as necessary. This was discussed with the patient. The risks as well as benefits were discussed with the patient. She is agreeable for transfusion.  2. GI bleed: Likely due to aspirin as well as nonsteroidal anti-inflammatory medications which were just recently started.  I will get Gastroenterology involved. The patient will likely need to have EGD done. We will continue PPI drip IV.  We will keep the patient n.p.o.  3. Dehydration: We will continue the patient on IV fluids.  4. Hypernatremia due to dehydration: Continue IV fluids, reassess sodium level in the morning.  5. Right lower extremity pain: We will get ultrasound to rule out deep venous  thrombosis; however, the patient may need to have a bone scan done as the pain also occurs in the bone. It was very uncomfortable when the patient's bone was palpated, and the pain is nocturnal. The patient also has alkaline phosphatase which is elevated. I am concerned about questionable metastatic disease.  6. Seizure disorder: Continue Keppra as well as Topamax. 7. Tobacco abuse: I discussed with the patient her tobacco abuse issue for approximately three minutes. She refuses any nicotine replacement therapy, however, tells me that she wants to quit herself. She already did not smoke for the past three days. We will be ordering for her some nicotine replacement just in case she changes her mind and she is more comfortable.   TIME SPENT: 50 minutes.    ____________________________ Katharina Caper, MD rv:cbb D: 03/21/2012 17:16:30 ET T: 03/21/2012 17:50:15 ET JOB#: [id number]  cc: Katharina Caper, MD, <Dictator> Dennison Mascot, MD Camden Mazzaferro MD ELECTRONICALLY SIGNED 04/29/2012 13:06

## 2014-10-02 NOTE — Consult Note (Signed)
Chief Complaint:   Subjective/Chief Complaint denies n/v or abd pain.  Some mild generalized discomfort.  no bm, c/o constipation.   VITAL SIGNS/ANCILLARY NOTES: **Vital Signs.:   15-Oct-13 19:15   Vital Signs Type Routine   Temperature Temperature (F) 98.5   Celsius 36.9   Pulse Pulse 78   Respirations Respirations 18   Systolic BP Systolic BP 458   Diastolic BP (mmHg) Diastolic BP (mmHg) 61   Mean BP 89   Pulse Ox % Pulse Ox % 98   Pulse Ox Activity Level  At rest   Oxygen Delivery Room Air/ 21 %   Pulse Ox Heart Rate 80   Brief Assessment:   Cardiac Regular    Respiratory clear BS    Gastrointestinal details normal Soft  Nondistended  No masses palpable  Bowel sounds normal  No rebound tenderness  mild generalized abdominal discomfort.   Lab Results:  Routine Chem:  15-Oct-13 04:24    Result Comment CALCIUM - RESULTS VERIFIED BY REPEAT TESTING.  - NOTIFIED OF CRITICAL VALUE  - CALLED RESULT TO DAN NEWTON AT 0998  - 03/29/12-DAC  - READ-BACK PROCESS PERFORMED.  Result(s) reported on 29 Mar 2012 at 12:09PM.   Result Comment BUN - CANCEL DUPLICATE ORDER SEE ACC#  - [id number]  Result(s) reported on 29 Mar 2012 at 09:38AM.   Result Comment wbc count - VERIFIED BY SMEAR ESTIMATE diff/nrbc - SLIDE PREVIOUSLY REVIEWED BY PATHOLOGIST  - mpg 03/29/12  Result(s) reported on 29 Mar 2012 at 05:59AM.   Glucose, Serum 99   BUN 16   BUN -   Creatinine (comp)  0.54   Sodium, Serum 143   Potassium, Serum 4.1   Chloride, Serum  114   CO2, Serum  19   Calcium (Total), Serum  6.8   Anion Gap 10   Osmolality (calc) 286   eGFR (African American) >60   eGFR (Non-African American) >60 (eGFR values <66m/min/1.73 m2 may be an indication of chronic kidney disease (CKD). Calculated eGFR is useful in patients with stable renal function. The eGFR calculation will not be reliable in acutely ill patients when serum creatinine is changing rapidly. It is not useful in  patients on  dialysis. The eGFR calculation may not be applicable to patients at the low and high extremes of body sizes, pregnant women, and vegetarians.)   Magnesium, Serum  1.6 (1.8-2.4 THERAPEUTIC RANGE: 4-7 mg/dL TOXIC: > 10 mg/dL  -----------------------)    16:02    Result Comment - SLIDE PREVIOUSLY REVIEWED BY PATHOLOGIST  - RESULTS VERIFIED BY REPEAT TESTING.  Result(s) reported on 29 Mar 2012 at 07:21PM.  Routine Hem:  15-Oct-13 04:24    WBC (CBC) 4.9   RBC (CBC)  2.63   Hemoglobin (CBC)  8.4   Hematocrit (CBC)  23.4   Platelet Count (CBC)  51   MCV 89   MCH 31.9   MCHC 35.9   RDW  14.7   Bands 8   Segmented Neutrophils 67   Lymphocytes 17   Monocytes 2   Eosinophil 1   Metamyelocyte 5   NRBC 14   Diff Comment 1 ANISOCYTOSIS   Diff Comment 2 POLYCHROMASIA   Diff Comment 3 NORMAL PLT MORPHOLGY  Result(s) reported on 29 Mar 2012 at 05:59AM.   Basophil # 0.0   Retic Count  4.35   Absolute Retic Count  0.1142 (Result(s) reported on 29 Mar 2012 at 04:41AM.)    16:02    WBC (CBC) 5.8  RBC (CBC)  3.65   Hemoglobin (CBC)  11.4   Hematocrit (CBC)  31.4   Platelet Count (CBC)  60 (Result(s) reported on 29 Mar 2012 at 07:21PM.)   MCV 86   MCH 31.3   MCHC  36.3   RDW  14.7   Bands 21   Segmented Neutrophils 60   Lymphocytes 12   Variant Lymphocytes 2   Monocytes 1   Eosinophil 1   Metamyelocyte 3   NRBC 7   Diff Comment 1 ANISOCYTOSIS   Diff Comment 2 POIKILOCYTOSIS   Diff Comment 3 POLYCHROMASIA   Diff Comment 4 PLTS APPEAR DECREASD   Diff Comment 5 PLTS VARIED IN SIZE   Assessment/Plan:  Assessment/Plan:   Assessment 1) severe duodenal ulcer secondary to H./ pylori and nsaid use. no recurrent bleeding noted since saturday.  marked drop of hgb and plt noted yesterday without evidence of recurrent bleeding except some increase of BUN.    Plan 1) labs improved today after tfx, soem increase fo hgb and plt over the course of the day.  Continue current with bid hgb,  tfx as needed.  awaiting further evaluation per Heme/Onc. Following. discussed with Dr James Ivanoff.   Electronic Signatures: Loistine Simas (MD)  (Signed 15-Oct-13 19:37)  Authored: Chief Complaint, VITAL SIGNS/ANCILLARY NOTES, Brief Assessment, Lab Results, Assessment/Plan   Last Updated: 15-Oct-13 19:37 by Loistine Simas (MD)

## 2014-10-02 NOTE — Op Note (Signed)
PATIENT NAME:  Katie Gilmore, Katie Gilmore MR#:  [id number] DATE OF BIRTH:  23-Oct-1945  DATE OF PROCEDURE:  04/25/2012  PREOPERATIVE DIAGNOSES:  1. Metastatic adenocarcinoma.  2. Gastrointestinal bleed.  3. Lack of appropriate IV access.   POSTOPERATIVE DIAGNOSES:  1. Metastatic adenocarcinoma.  2. Gastrointestinal bleed.  3. Lack of appropriate IV access.   PROCEDURE PERFORMED: Insertion of right IJ double lumen Infuse-a-Port.   SURGEON: Levora DredgeGregory Joshva Labreck, MD  ANESTHESIA: Versed 3 mg plus fentanyl 100 mcg administered IV. Continuous ECG, pulse oximetry, and cardiopulmonary monitoring is performed throughout the entire procedure by the interventional radiology nurse. Total sedation time was 45 minutes.   ACCESS: Right IJ.   CONTRAST USED: None.   FLUORO TIME: 0.4 minutes.   INDICATIONS: Katie Gilmore is a 69 year old woman with multiple medical problems who now requires multiple parenteral medications including possible chemotherapy. The risks and benefits for Infuse-a-Port placement were reviewed with the patient as well as with Dr. Lorre NickGittin, her oncologist. Based on the need, she will require a double lumen port and this will be placed. The patient agrees.   DESCRIPTION OF PROCEDURE: The patient is taken to special procedures and placed in the supine position. After adequate sedation is achieved, the right neck and chest wall are prepped and draped in a sterile fashion. Collier Flowersoban is then placed to exclude contamination from the skin. 1% lidocaine is infiltrated at the base of the neck after ultrasound is used to evaluate the jugular vein. It is also infused in the chest wall two fingerbreadths below the clavicle.   Ultrasound, which has been placed in a sterile sleeve, is then utilized for access. The jugular vein is identified, is compressible indicating patency, image is recorded, and under real-time visualization Seldinger needle is inserted into the jugular vein and J-wire is advanced under  fluoroscopic guidance. Counterincision is made with an 11 blade scalpel and subsequently dilator and peel-away sheath are advanced.   A small pocket is then created on the chest wall after incision with an 11 blade scalpel and both blunt and sharp dissection is used with Metzenbaum scissors. The port pocket is tested for appropriate size. Subsequently the catheter is pulled subcutaneously from the pocket to the neck counterincision. It is then advanced through the peel-away sheath, which is removed. Under fluoroscopy, the catheter is positioned with its tip at the atriocaval junction. It is then transected, the hub is connected, and then slipped into the pocket without difficulty. Both lumens are then aspirated and flushed easily with heparinized saline. The port pocket incision is closed with interrupted 3-0 Vicryl followed by 4-0 Monocryl subcuticular and Dermabond. The neck counterincision is closed with 4-0 Monocryl subcuticular and Dermabond. The patient tolerated the procedure well and there were no immediate complications. Sponge and needle counts were correct. She was taken to the recovery area in excellent condition.  ____________________________ Renford DillsGregory G. Fleurette Woolbright, MD ggs:slb D: 04/25/2012 14:59:45 ET T: 04/25/2012 15:26:27 ET JOB#: [id number]  cc: Renford DillsGregory G. Shakiera Edelson, MD, <Dictator> Knute Neuobert G. Lorre NickGittin, MD Dennison MascotLemont Morrisey, MD

## 2014-10-02 NOTE — Consult Note (Signed)
Chief Complaint:   Subjective/Chief Complaint eventsnoted.  Patietn transferred to ICU due to hypotension and tachycardia. No evidence of active GI bleeding, although marked change of hgb and further decline of platelets noted.   VITAL SIGNS/ANCILLARY NOTES: **Vital Signs.:   14-Oct-13 23:03   Vital Signs Type 15 min Post Blood Start Time   Temperature Temperature (F) 98.5   Celsius 36.9   Temperature Source oral   Pulse Pulse 100   Pulse source if not from Vital Sign Device per cardiac monitor   Respirations Respirations 17   Systolic BP Systolic BP 88   Diastolic BP (mmHg) Diastolic BP (mmHg) 36   Mean BP 53   BP Source  if not from Vital Sign Device non-invasive   Pulse Ox % Pulse Ox % 100   Oxygen Delivery Room Air/ 21 %   Brief Assessment:   Cardiac Regular    Respiratory clear BS    Gastrointestinal details normal Soft  Nontender  Nondistended  No masses palpable  Bowel sounds normal   Lab Results: Rhogam:  14-Oct-13 17:18    Notification Lenna Sciara cobb   Notification Date 03-28-2012   Notification Time 2015 (Result(s) reported on 28 Mar 2012 at 08:42PM.)  Routine BB:  14-Oct-13 17:18    ABO Group + Rh Type A Positive  Result(s) reported on 28 Mar 2012 at 08:42PM.   Fresh Frozen Plasma Unit 1 Issued (Result(s) reported on 28 Mar 2012 at 10:43PM.)   Antibody 1 Anti- KIDD B    17:40    ABO Group + Rh Type A Positive   Antibody Screen POSITIVE (Result(s) reported on 28 Mar 2012 at 08:37PM.)    19:08    Crossmatch Unit 1 Ready   Crossmatch Unit 2 Ready (Result(s) reported on 28 Mar 2012 at 08:58PM.)   Platelets (Blood Component) Issued (Result(s) reported on 28 Mar 2012 at 09:06PM.)  General Ref:  11-Oct-13 04:50    Alkaline Phos Isoenzymes ========== TEST NAME ==========  ========= RESULTS =========  = REFERENCE RANGE =  ALKALINE PHOS.ISOENZYMES  Alk Phos Isoenzyme Alkaline Phosphatase, S         [H  581 IU/L             ]            47-112 Liver Fraction:                  [L  5 %                 ]             26-86 Bone Fraction:                  [H  95 %                 ]             11-68 Intestinal Frac.:               [   0 %                  ]              0-16               [facility]            No: [id number]           [address], [place], [place] [address]  Lindon Romp, MD         [phone]   Result(s) reported on 28 Mar 2012 at 03:48PM.    14:36    Protein Immunoelectrophoresis, Serum ========== TEST NAME ==========  ========= RESULTS =========  = REFERENCE RANGE =  PROT IMMUNOELECTROPHORES    Free Kappa and Lambda Light Ch.qt,ratio ========== TEST NAME ==========  ========= RESULTS =========  = REFERENCE RANGE =  Kappa/Lambda Free Light  Free K+L Lt Chains,Qn,S Free Kappa Lt Chains,S          [   13.58 mg/L           ]        3.30-19.40 Free Lambda Lt Chains,S         [13.44 mg/L           ]        5.71-26.30 Kappa/Lambda Ratio,S            [   1.01                 ]         0.26-1.65               [facility]            No: [id number]           [address], [place], [place] [address]           Lindon Romp, MD         [phone]   Result(s) reported on 28 Mar 2012 at 03:48PM.  Routine Chem:  14-Oct-13 04:02    Result Comment diff/nrbc - SLIDE PREVIOUSLY REVIEWED BY PATHOLOGIST  - mpg 03/28/12  Result(s) reported on 28 Mar 2012 at 80:32ZY.   Folic Acid, Serum 24.8 (Result(s) reported on 28 Mar 2012 at 08:18AM.)   Glucose, Serum  105   BUN 9   Creatinine (comp)  0.46   Sodium, Serum 143   Potassium, Serum 3.8   Chloride, Serum  116   CO2, Serum  17   Calcium (Total), Serum  7.1   Anion Gap 10   Osmolality (calc) 284   eGFR (African American) >60   eGFR (Non-African American) >60 (eGFR values <36m/min/1.73 m2 may be an indication of chronic kidney disease (CKD). Calculated eGFR is useful in patients with stable renal function. The eGFR calculation will not be  reliable in acutely ill patients when serum creatinine is changing rapidly. It is not useful in  patients on dialysis. The eGFR calculation may not be applicable to patients at the low and high extremes of body sizes, pregnant women, and vegetarians.)    17:18    Result Comment GROUP/RH - WRONG TEST ORDERED/ TYPE AND SCREEN  - WILL BE ORDERED   Result Comment cbc - SLIDE PREVIOUSLY REVIEWED BY PATHOLOGIST  Routine Coag:  14-Oct-13 20:12    Prothrombin  18.2   INR 1.5 (INR reference interval applies to patients on anticoagulant therapy. A single INR therapeutic range for coumarins is not optimal for all indications; however, the suggested range for most indications is 2.0 - 3.0. Exceptions to the INR Reference Range may include: Prosthetic heart valves, acute myocardial infarction, prevention of myocardial infarction, and combinations of aspirin and anticoagulant. The need for a higher or lower target INR must be assessed individually. Reference: The Pharmacology and Management of the Vitamin K  antagonists: the seventh ACCP Conference on Antithrombotic and Thrombolytic Therapy. CGNOIB.7048Sept:126 (3suppl): [id number] A HCT  value >55% may artifactually increase the PT.  In one study,  the increase was an average of 25%. Reference:  "Effect on Routine and Special Coagulation Testing Values of Citrate Anticoagulant Adjustment in Patients with High HCT Values." American Journal of Clinical Pathology 2006;126:400-405.)   Fibrinogen  162 (A HCT value >55% may artifactually increase the Fibrinogen.  In one study, the increase was an average of 10%. Reference:  "Effect of Routine and Special Coagulation Testing Values of Citrate Anticoagulant Adjustment in Patients with High HCT Values." American Journal of Clinical Pathology 2006;126:400-405.)   Activated PTT (APTT) 30.8 (A HCT value >55% may artifactually increase the APTT. In one study, the increase was an average of 19%. Reference:  "Effect on Routine and Special Coagulation Testing Values of Citrate Anticoagulant Adjustment in Patients with High HCT Values." American Journal of Clinical Pathology 2006;126:400-405.)  Routine Hem:  14-Oct-13 04:02    WBC (CBC) 5.6   RBC (CBC)  2.64   Hemoglobin (CBC)  8.2   Hematocrit (CBC)  23.3   Platelet Count (CBC)  44   MCV 88   MCH 31.0   MCHC 35.1   RDW  15.0   Bands 5   Segmented Neutrophils 68   Lymphocytes 18   Monocytes 3   Eosinophil 2   Metamyelocyte 3   NRBC 7   Diff Comment 1 ANISOCYTOSIS   Diff Comment 2 POLYCHROMASIA   Diff Comment 3 MICROCYTES PRESENT   Diff Comment 4 NORMAL PLT MORPHOLGY  Result(s) reported on 28 Mar 2012 at 05:20AM.    17:18    WBC (CBC) 4.0   RBC (CBC)  1.77   Hemoglobin (CBC)  5.4   Hematocrit (CBC)  15.6   Platelet Count (CBC)  33 (Result(s) reported on 28 Mar 2012 at 07:14PM.)   MCV 89   MCH 30.7   MCHC 34.7   RDW  14.9   Bands 18   Segmented Neutrophils 55   Lymphocytes 15   Monocytes 2   Eosinophil 2   Metamyelocyte 4   Myelocyte 4   NRBC 4   Diff Comment 1 ANISOCYTOSIS   Diff Comment 2 POIKILOCYTOSIS   Diff Comment 3 POLYCHROMASIA   Diff Comment 4 BASOPHILIC STIPPLING   Diff Comment 5 TARGET CELLS   Diff Comment 6 PLTS APPEAR DECREASD   Diff Comment 7 PLTS VARIED IN SIZE  Result(s) reported on 28 Mar 2012 at 07:13PM.   Assessment/Plan:  Assessment/Plan:   Assessment 1) anemia, thrombocytopenia-recurrent hypotension and tachycardia. no evidence of active GI bleeding, although there is a drop of 3 units hgb.  some further decline of platelets since yesterday.  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 On abx for treatment and iv bid ppi. 3) abnormal alk phos, ct (?spine lesion)  and h/o bone pain.    Plan 1) agree with icu admission and tranfusion of prbc and platelets.  Continue treatment of DU.  May need to switch ppi to a high dose iv h2ra, although may not be as  effective for ulcer, ppi sometimes can cause decreased platelets, however abx can also be a problem. Of note, patient had thrombocytopenia on admission.  Continue current, recheck labs in am.  Awaiting further Onc input/evaluation.  discussed with Dr Bridgette Habermann.   Electronic Signatures: Loistine Simas (MD)  (Signed 14-Oct-13 23:32)  Authored: Chief Complaint, VITAL SIGNS/ANCILLARY NOTES, Brief Assessment, Lab Results, Assessment/Plan   Last Updated: 14-Oct-13 23:32 by Loistine Simas (MD)

## 2014-10-02 NOTE — Consult Note (Signed)
Chief Complaint:   Subjective/Chief Complaint NO ACUTE COMPLAINTS, NO SOB OR ABDO PAIN OR NAUSEA OR DARK STOOLS WHEN SEEN EARLIER IN EVENING   VITAL SIGNS/ANCILLARY NOTES: **Vital Signs.:   14-Oct-13 14:06   Vital Signs Type Recheck   Pulse Pulse 96   Respirations Respirations 20   Systolic BP Systolic BP 98   Diastolic BP (mmHg) Diastolic BP (mmHg) 52   Mean BP 67    18:29   Vital Signs Type Q 4hr   Temperature Temperature (F) 98.2   Celsius 36.7   Temperature Source Oral   Pulse Pulse 105   Respirations Respirations 20   Systolic BP Systolic BP 77   Diastolic BP (mmHg) Diastolic BP (mmHg) 50   Mean BP 59   Pulse Ox % Pulse Ox % 97   Pulse Ox Activity Level  At rest   Oxygen Delivery Room Air/ 21 %   Brief Assessment:   Additional Physical Exam WHEN SEEN EARLIER, ALERT AND COOPERATIVE, SOME PALLOR NO COUGH NO EDEMA NEURO NON FFOCAL   Lab Results: Routine BB:  14-Oct-13 17:18    ABO Group + Rh Type -    19:08    Platelets (Blood Component) Ready (Result(s) reported on 28 Mar 2012 at 08:30PM.)  Routine Chem:  14-Oct-13 04:02    Result Comment diff/nrbc - SLIDE PREVIOUSLY REVIEWED BY PATHOLOGIST  - mpg 03/28/12  Result(s) reported on 28 Mar 2012 at 01:77LT.   Folic Acid, Serum 90.3 (Result(s) reported on 28 Mar 2012 at 08:18AM.)   Glucose, Serum  105   BUN 9   Creatinine (comp)  0.46   Sodium, Serum 143   Potassium, Serum 3.8   Chloride, Serum  116   CO2, Serum  17   Calcium (Total), Serum  7.1   Anion Gap 10   Osmolality (calc) 284   eGFR (African American) >60   eGFR (Non-African American) >60 (eGFR values <20m/min/1.73 m2 may be an indication of chronic kidney disease (CKD). Calculated eGFR is useful in patients with stable renal function. The eGFR calculation will not be reliable in acutely ill patients when serum creatinine is changing rapidly. It is not useful in  patients on dialysis. The eGFR calculation may not be applicable to patients at the low  and high extremes of body sizes, pregnant women, and vegetarians.)    17:18    Result Comment GROUP/RH - WRONG TEST ORDERED/ TYPE AND SCREEN  - WILL BE ORDERED  Result(s) reported on 28 Mar 2012 at 07:13PM.   Result Comment cbc - SLIDE PREVIOUSLY REVIEWED BY PATHOLOGIST  Result(s) reported on 28 Mar 2012 at 07:14PM.  Routine Hem:  14-Oct-13 04:02    WBC (CBC) 5.6   RBC (CBC)  2.64   Hemoglobin (CBC)  8.2   Hematocrit (CBC)  23.3   Platelet Count (CBC)  44   MCV 88   MCH 31.0   MCHC 35.1   RDW  15.0   Bands 5   Segmented Neutrophils 68   Lymphocytes 18   Monocytes 3   Eosinophil 2   Metamyelocyte 3   NRBC 7   Diff Comment 1 ANISOCYTOSIS   Diff Comment 2 POLYCHROMASIA   Diff Comment 3 MICROCYTES PRESENT   Diff Comment 4 NORMAL PLT MORPHOLGY  Result(s) reported on 28 Mar 2012 at 05:20AM.    17:18    WBC (CBC) 4.0   RBC (CBC)  1.77   Hemoglobin (CBC)  5.4   Hematocrit (CBC)  15.6  Platelet Count (CBC)  33   MCV 89   MCH 30.7   MCHC 34.7   RDW  14.9   Bands 18   Segmented Neutrophils 55   Lymphocytes 15   Monocytes 2   Eosinophil 2   Metamyelocyte 4   Myelocyte 4   NRBC 4   Diff Comment 1 ANISOCYTOSIS   Diff Comment 2 POIKILOCYTOSIS   Diff Comment 3 POLYCHROMASIA   Diff Comment 4 BASOPHILIC STIPPLING   Diff Comment 5 TARGET CELLS   Diff Comment 6 PLTS APPEAR DECREASD   Diff Comment 7 PLTS VARIED IN SIZE  Result(s) reported on 28 Mar 2012 at 07:14PM.   Assessment/Plan:  Assessment/Plan:   Assessment ANEMIA DUE TO BLEED, ALSO POSSSIBLE PREEXISTING SOME ANEMIA, THROMBOCYTOPENIA DUE TO BLEED AND CONSUMPTION, POSSIBLE OTHER CAUSES, DIFFERENTIAL HAS INCLUDED MPD OR MDS OR MARROW INFILTRATING DISEASE/MALIGNANCY., ALSO DRUG INDUCED POSSIBLE. ITP HAS BEEN CONSIDERED UNLIKELY DUE TO PRESENTATION AND TIME COURSE. NOTE HIGH BONE FRACTION OF ALK PHOS. HIV WAS NEG. HEP C PENSING. LIGHT CHAINS NORMAL SIEP WAS STILL PENDING. PRIOR SMEAR ANISOCYTOSIS BUT NOT DIAGNOSTIC. B12  LOW IDENTIFIED. CT WAS NON DIAGNOSTIC. ABNORMAL SCLEROTIC LESIONS OF BONE SEEN.  AS DISCUSSED WITH DR Donnella Sham EARLIER, AND PATIENT AND FAMILY EARLIER TODAY, PLANNED TO REVIEW SIEP, AND BONE SCAN IN AM, AND BM EXAM 10/15 OR 10/16 IF PLTS DID NOT BEGIN TO RECOVER. I ORDERED CBC DONE EARLIER THAN PRIOR SCHEDULED, AND ORDERED PLTS INITIALLY TO BE GIVEN EMPIRICALLY AS LONG AS COUNT BELOW 50K.  NOW, EVENTS AS REVIEWED BY MEDICINE OCCURRED SHORTLY AFTERWARDS, PLTS TO BE TRANSFUSED, PRBC TO BE TRANSFUSED, ADDITIONAL PLTS PRN, PLABMA PLANNED, NEED SERIAL CBC AND CHEMISTRY, COULD GIVE PREDNISONE EMPIRICALLY. CLINICAL COURSE NOW WILL REVEAL IF VALUE IN BM EXAM.ACUTE MANAGEMENT TONIGHT WILL BE DIRCTED BY MEDICINE.   Electronic Signatures: Dallas Schimke (MD)  (Signed 14-Oct-13 20:41)  Authored: Chief Complaint, VITAL SIGNS/ANCILLARY NOTES, Brief Assessment, Lab Results, Assessment/Plan   Last Updated: 14-Oct-13 20:41 by Dallas Schimke (MD)

## 2014-10-02 NOTE — Consult Note (Signed)
Chief Complaint:   Subjective/Chief Complaint stable overnight, no evidence of recurrent bleeding.  continues with mild abdominal discomfort, improved from admission.   VITAL SIGNS/ANCILLARY NOTES: **Vital Signs.:   09-Oct-13 05:22   Vital Signs Type Routine   Temperature Temperature (F) 97.2   Celsius 36.2   Temperature Source Oral   Pulse Pulse 87   Respirations Respirations 24   Systolic BP Systolic BP 136   Diastolic BP (mmHg) Diastolic BP (mmHg) 60   Mean BP 85   Pulse Ox % Pulse Ox % 96   Pulse Ox Activity Level  At rest   Oxygen Delivery Room Air/ 21 %   Brief Assessment:   Cardiac Regular    Respiratory clear BS    Gastrointestinal details normal Soft  Nondistended  No masses palpable  Bowel sounds normal  No rebound tenderness  mild epigastric tenderness   Lab Results: Routine Coag:  09-Oct-13 06:15    Prothrombin  16.0   INR 1.2 (INR reference interval applies to patients on anticoagulant therapy. A single INR therapeutic range for coumarins is not optimal for all indications; however, the suggested range for most indications is 2.0 - 3.0. Exceptions to the INR Reference Range may include: Prosthetic heart valves, acute myocardial infarction, prevention of myocardial infarction, and combinations of aspirin and anticoagulant. The need for a higher or lower target INR must be assessed individually. Reference: The Pharmacology and Management of the Vitamin K  antagonists: the seventh ACCP Conference on Antithrombotic and Thrombolytic Therapy. Chest.2004 Sept:126 (3suppl): [id number]. A HCT value >55% may artifactually increase the PT.  In one study,  the increase was an average of 25%. Reference:  "Effect on Routine and Special Coagulation Testing Values of Citrate Anticoagulant Adjustment in Patients with High HCT Values." American Journal of Clinical Pathology 2006;126:400-405.)  Routine Hem:  09-Oct-13 06:15    WBC (CBC) 7.7   RBC (CBC)  3.24   Hemoglobin  (CBC)  9.9   Hematocrit (CBC)  27.6   Platelet Count (CBC)  76 (Result(s) reported on 23 Mar 2012 at 06:55AM.)   MCV 85   MCH 30.5   MCHC 35.8   RDW 13.5   Assessment/Plan:  Assessment/Plan:   Assessment 1) reported melena, epigastric pain in the setting of nsaid use. stable not recurrent.  2) thrombocytopenia-etiology not known at present.  relatively stable since yesterday.  No active bleeding since admission.    Plan 1) EGD today.  I have discussed the risks benefits and complications of egd to include not limited to bleeding infection  perforation and sedation and she wishes to proceed.  I have discussed the increased risk of bleeding due to low platelets.   2) due to the elevated ap, recommend abdominal ultrasound.   Electronic Signatures: Barnetta ChapelSkulskie, Kendal Raffo (MD)  (Signed 09-Oct-13 07:28)  Authored: Chief Complaint, VITAL SIGNS/ANCILLARY NOTES, Brief Assessment, Lab Results, Assessment/Plan   Last Updated: 09-Oct-13 07:28 by Barnetta ChapelSkulskie, Lanice Folden (MD)

## 2014-10-02 NOTE — Consult Note (Signed)
    Comments   Consulted for discussion regarding goals. Note that family is awaiting Dr Neale BurlyGitten for clarification of oncological plan. Will see in the am.   Electronic Signatures: Esaw Knippel, Daryl EasternJoshua R (NP)  (Signed 21-Oct-13 15:57)  Authored: Palliative Care   Last Updated: 21-Oct-13 15:57 by Malachy MoanBorders, Agustus Mane R (NP)

## 2014-10-02 NOTE — Consult Note (Signed)
Chief Complaint:   Subjective/Chief Complaint mild nausea this am, no emesis or abdominal pain.   VITAL SIGNS/ANCILLARY NOTES: **Vital Signs.:   25-Oct-13 13:24   Vital Signs Type Routine   Temperature Temperature (F) 97.5   Celsius 36.3   Temperature Source oral   Pulse Pulse 95   Respirations Respirations 20   Systolic BP Systolic BP 132   Diastolic BP (mmHg) Diastolic BP (mmHg) 73   Mean BP 92   Pulse Ox % Pulse Ox % 95   Pulse Ox Activity Level  At rest   Oxygen Delivery Room Air/ 21 %   Brief Assessment:   Cardiac Regular    Respiratory clear BS    Gastrointestinal details normal Soft  Nontender  Nondistended  No masses palpable  Bowel sounds normal   Lab Results: Routine BB:  25-Oct-13 09:04    ABO Group + Rh Type A Positive   Antibody Screen NEGATIVE (Result(s) reported on 08 Apr 2012 at 10:40AM.)    15:39    Platelets (Blood Component) Issued (Result(s) reported on 08 Apr 2012 at 04:01PM.)  Routine Chem:  25-Oct-13 06:04    Potassium, Serum 3.8 (Result(s) reported on 08 Apr 2012 at 08:37AM.)   Phosphorus, Serum  2.0 (Result(s) reported on 08 Apr 2012 at 06:38AM.)   Magnesium, Serum 1.9 (1.8-2.4 THERAPEUTIC RANGE: 4-7 mg/dL TOXIC: > 10 mg/dL  -----------------------)   Result Comment DIFF - SLIDE PREVIOUSLY REVIEWED BY PATHOLOGIST PLT - VERIFIED BY SMEAR ESTIMATE  - NOTIFIED OF CRITICAL VALUE  - c/ glenda fields @0905  04-08-12.ajo  - READ-BACK PROCESS PERFORMED.  - RESULTS VERIFIED BY REPEAT TESTING. wbc - VERIFIED BY SMEARESTIMATE  Result(s) reported on 08 Apr 2012 at 08:36AM.  Routine Hem:  25-Oct-13 06:04    WBC (CBC) 8.6   RBC (CBC)  2.57   Hemoglobin (CBC)  7.4   Hematocrit (CBC)  23.2   Platelet Count (CBC)  17   MCV 90   MCH 29.0   MCHC 32.1   RDW  15.2   Bands 8   Segmented Neutrophils 71   Lymphocytes 14   Monocytes 5   Metamyelocyte 1   Myelocyte 1   NRBC 35   Diff Comment 1 ANISOCYTOSIS   Diff Comment 2 POIKILOCYTOSIS   Diff  Comment 3 POLYCHROMASIA   Diff Comment 4 PLTS VARIED IN SIZE  Result(s) reported on 08 Apr 2012 at 08:36AM.   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  recurrent melena with profound thrombocytopenia. Positive h. pylori, positive use of nsaids.  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  Marked thrombocytopenia.    Plan 1) transfusions noted, no recurrent bleeding since duodenal embolization.  Awaiting further disposition per oncology.  continue iv ppi.  S/P h/ pylori eradication.  no new GI recs.   Electronic Signatures: Barnetta ChapelSkulskie, Martin (MD)  (Signed 25-Oct-13 19:01)  Authored: Chief Complaint, VITAL SIGNS/ANCILLARY NOTES, Brief Assessment, Lab Results, Assessment/Plan   Last Updated: 25-Oct-13 19:01 by Barnetta ChapelSkulskie, Martin (MD)

## 2014-10-02 NOTE — Consult Note (Signed)
Chief Complaint:   Subjective/Chief Complaint mild nausea this am, not currently, no emesis.  no bm over 48 hours. denies abdominal pain.   VITAL SIGNS/ANCILLARY NOTES: **Vital Signs.:   26-Oct-13 05:31   Vital Signs Type Routine   Temperature Temperature (F) 97.9   Celsius 36.6   Temperature Source Oral   Pulse Pulse 89   Respirations Respirations 18   Systolic BP Systolic BP 263   Diastolic BP (mmHg) Diastolic BP (mmHg) 77   Mean BP 101   Pulse Ox % Pulse Ox % 96   Pulse Ox Activity Level  At rest   Oxygen Delivery Room Air/ 21 %   Brief Assessment:   Cardiac Regular    Respiratory clear BS    Gastrointestinal details normal Soft  Nontender  Nondistended  No masses palpable  Bowel sounds normal   Lab Results: Routine Chem:  26-Oct-13 19:31    Phosphorus, Serum  2.3 (Result(s) reported on 09 Apr 2012 at 08:56AM.)   Magnesium, Serum 2.0 (1.8-2.4 THERAPEUTIC RANGE: 4-7 mg/dL TOXIC: > 10 mg/dL  -----------------------)   Glucose, Serum  144   BUN 14   Creatinine (comp)  0.41   Sodium, Serum 145   Potassium, Serum 4.0   Chloride, Serum  115   CO2, Serum  20   Calcium (Total), Serum  7.2   Anion Gap 10   Osmolality (calc) 292   eGFR (African American) >60   eGFR (Non-African American) >60 (eGFR values <46m/min/1.73 m2 may be an indication of chronic kidney disease (CKD). Calculated eGFR is useful in patients with stable renal function. The eGFR calculation will not be reliable in acutely ill patients when serum creatinine is changing rapidly. It is not useful in  patients on dialysis. The eGFR calculation may not be applicable to patients at the low and high extremes of body sizes, pregnant women, and vegetarians.)   Result Comment LABS - This specimen was collected through an   - indwelling catheter or arterial line.  - A minimum of 537m of blood was wasted prior    - to collecting the sample.  Interpret  - results with caution. DIFF - SLIDE PREVIOUSLY  REVIEWED BY PATHOLOGIST  Result(s) reported on 09 Apr 2012 at 06:41AM.  Routine Hem:  26-Oct-13 19:31    WBC (CBC) 6.9   RBC (CBC)  2.21   Hemoglobin (CBC)  7.1   Hematocrit (CBC)  20.3   Platelet Count (CBC)  41 (Result(s) reported on 09 Apr 2012 at 08:56AM.)   MCV 92   MCH 32.0   MCHC 34.9   RDW  15.2   Bands 10   Segmented Neutrophils 75   Lymphocytes 8   Variant Lymphocytes 1   Monocytes 3   Metamyelocyte 2   Myelocyte 1   NRBC 27   Diff Comment 1 ANISOCYTOSIS   Diff Comment 2 POIKILOCYTOSIS   Diff Comment 3 POLYCHROMASIA   Diff Comment 4 MICROCYTES PRESENT   Diff Comment 5 MACROCYTES PRESENT   Diff Comment 6 SCHISTOCYTES   Diff Comment 7 HELMET CELLS   Diff Comment 8 PLTS VARIED IN SIZE  Result(s) reported on 09 Apr 2012 at 08:56AM.   Assessment/Plan:  Assessment/Plan:   Assessment 1) duodenal ulcer/bleeding-stable s/p intravasvular embolization.   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  Normal CEA.  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    Plan 1) will trial ensure supplements.  Poor po intake.   following.   Electronic  Signatures: Loistine Simas (MD)  (Signed 26-Oct-13 15:16)  Authored: Chief Complaint, VITAL SIGNS/ANCILLARY NOTES, Brief Assessment, Lab Results, Assessment/Plan   Last Updated: 26-Oct-13 15:16 by Loistine Simas (MD)

## 2014-10-02 NOTE — Consult Note (Signed)
PATIENT NAME:  [name], Katie F MR#:  [id number] DATE OF BIRTH:  [birth year]  DATE OF CONSULTATION:  03/26/2012  REFERRING PHYSICIAN:   CONSULTING PHYSICIAN:  Adah Salvageichard E. Excell Seltzerooper, MD  CHIEF COMPLAINT: Gastrointestinal bleed.  HISTORY OF PRESENT ILLNESS: This is a patient who has been in the hospital for several days following a GI bleed which started approximately six days ago. She started having melena and hematochezia, has had some nausea but no hematemesis. She was admitted to the hospital and work-up has shown duodenal ulcers.   Dr. Niel HummerIftikhar asked me to see the patient and we spoke to personally concerning the patient's care. She has a duodenal ulcer and thrombocytopenia. He is transfusing her both platelets and packed red blood cells. Currently she has stable vital signs. I was asked to see the patient in case she were to fail medical nonoperative management and require surgical intervention.   PAST MEDICAL HISTORY:  1. Seizure disorder.  2. Hyperlipidemia.  3. Osteoporosis.   PAST SURGICAL HISTORY:  1. Cholecystectomy.  2. Appendectomy.  3. Hysterectomy.   FAMILY HISTORY: Noncontributory.   SOCIAL HISTORY: The patient does not smoke or drink.   REVIEW OF SYSTEMS: Ten system review is performed and negative with the exception of that mentioned in the history of present illness.   MEDICATIONS: Multiple, see chart.   ALLERGIES: Multiple - Depakote, Dilantin, metronidazole, Tegretol.   PHYSICAL EXAMINATION:   GENERAL: Thin, frail-appearing Caucasian female patient.   VITAL SIGNS: Temperature 98, pulse 93, respirations 20, blood pressure 92/60 and she has been in the 90s for essentially the entire hospitalization or at least since yesterday when her max was 113, and 97% room air saturation.   HEENT: No scleral icterus.   NECK: No palpable neck nodes.   CHEST: Clear to auscultation.   CARDIAC: Regular rate and rhythm.   ABDOMEN: Soft, nontender.   EXTREMITIES: No edema.    NEUROLOGIC: Grossly intact.   INTEGUMENT: No jaundice.  LABS/RADIOLOGIC STUDIES: EGD report is reviewed and discussed with Dr. Niel HummerIftikhar.   Most recent laboratory values demonstrate a hemoglobin of 6.3, hematocrit 17.8, and 54,000 platelets. White blood cell count is 6.0. She is currently being transfused. Electrolytes demonstrate a bicarbonate of 19, serum albumin of 2.5, and a creatinine of 0.5.          ASSESSMENT AND PLAN: This is a patient with GI bleed likely from duodenal ulcer seen on EGD. She is also thrombocytopenic for unknown reasons. Dr. Lurline DelShaukat Iftikhar is transfusing the patient both platelets and packed red blood cells. I will be happy to be available if needed for surgical intervention should nonoperative methods fail. ____________________________ Adah Salvageichard E. Excell Seltzerooper, MD rec:slb D: 03/26/2012 17:24:19 ET T: 03/27/2012 10:19:09 ET JOB#: [id number]  cc: Adah Salvageichard E. Excell Seltzerooper, MD, <Dictator> Lattie HawICHARD E Brynlei Klausner MD ELECTRONICALLY SIGNED 03/27/2012 12:27

## 2014-10-02 NOTE — Consult Note (Signed)
Brief Consult Note: Diagnosis: melena, GI bleed.   Patient was seen by consultant.   Recommend to proceed with surgery or procedure.   Orders entered.   Comments: Patietn seen and examined, full consult to follow.  69 yo female presenting with anemia and black stools in the setting of NSAID use.  Currently on iv ppi drip.  Patietn with intermittant epigastric pain, n/v for about a year.  10# wt loss over the past 2 months.  No bm overnight.  Long term smoker.  Thrombocytopenia noted, with elevated alk phos.  Right leg pain, possible boney pain?  Will do egd today.  I have discussed the risks benefits and complications of egd to include not limited to bleeding infection perforation and sedation and she wishes to proceed.  Further recs to follow.  Electronic Signatures: Loistine Simas (MD)  (Signed 08-Oct-13 07:51)  Authored: Brief Consult Note   Last Updated: 08-Oct-13 07:51 by Loistine Simas (MD)

## 2014-10-02 NOTE — Consult Note (Signed)
Chief Complaint:   Subjective/Chief Complaint emesis of about 150-175 ml of dark blood about 2 hours ago.  Nausea earlier today, none now.  denies abdominal pain. Melena this am and last night.   VITAL SIGNS/ANCILLARY NOTES: **Vital Signs.:   21-Oct-13 13:24   Vital Signs Type Routine   Temperature Temperature (F) 98   Celsius 36.6   Temperature Source Oral   Pulse Pulse 92   Respirations Respirations 20   Systolic BP Systolic BP 423   Diastolic BP (mmHg) Diastolic BP (mmHg) 66   Mean BP 80   Pulse Ox % Pulse Ox % 94   Pulse Ox Activity Level  At rest   Oxygen Delivery Room Air/ 21 %   Brief Assessment:   Cardiac Regular    Respiratory clear BS    Gastrointestinal details normal Soft  Nontender  Nondistended  No masses palpable  Bowel sounds normal   Lab Results: Hepatic:  21-Oct-13 02:27    Bilirubin, Total 0.9   Alkaline Phosphatase  878   SGPT (ALT) 20   SGOT (AST)  41   Total Protein, Serum  4.2   Albumin, Serum  2.2  Routine Chem:  21-Oct-13 02:27    Result Comment MANUAL DIFFERENTIAL - SLIDE PREVIOUSLY REVIEWED BY PATHOLOGIST  - TPL  Result(s) reported on 04 Apr 2012 at 03:44AM.   Result Comment CALCIUM - NOTIFIED OF CRITICAL VALUE  - RESULTS VERIFIED BY REPEAT TESTING.  - CALLED TO [place] @0300 ,04/04/12  - READ-BACK PROCESS PERFORMED.  - TPL  Result(s) reported on 04 Apr 2012 at 03:11AM.   Glucose, Serum 98   BUN 16   Creatinine (comp)  0.53   Sodium, Serum 145   Potassium, Serum  3.4   Chloride, Serum  115   CO2, Serum 21   Calcium (Total), Serum  6.9   Osmolality (calc) 290   eGFR (African American) >60   eGFR (Non-African American) >60 (eGFR values <73m/min/1.73 m2 may be an indication of chronic kidney disease (CKD). Calculated eGFR is useful in patients with stable renal function. The eGFR calculation will not be reliable in acutely ill patients when serum creatinine is changing rapidly. It is not useful in  patients on dialysis.  The eGFR calculation may not be applicable to patients at the low and high extremes of body sizes, pregnant women, and vegetarians.)   Anion Gap 9  Routine Hem:  21-Oct-13 02:27    WBC (CBC) 6.4   RBC (CBC)  2.46   Hemoglobin (CBC)  7.3   Hematocrit (CBC)  20.9   Platelet Count (CBC)  43 (Result(s) reported on 04 Apr 2012 at 03:44AM.)   MCV 85   MCH 29.6   MCHC 34.9   RDW  15.5   Bands 6   Segmented Neutrophils 66   Lymphocytes 15   Monocytes 2   Eosinophil 1   Metamyelocyte 7   Myelocyte 3   NRBC 17   Diff Comment 1 ANISOCYTOSIS   Diff Comment 2 POIKILOCYTOSIS   Diff Comment 3 PLTS VARIED IN SIZE   Assessment/Plan:  Assessment/Plan:   Assessment 1)  duodenal ulcer-patient h.pylori positive and history of nsaid use.  recurrent small volume hematemesis despite 2 weeks of ppi and treatment of h. pylori.  There is the question of whether the ulcer is possible the primary lesion re metastatic bone ca.   Either of the other etiologies could cause a lesion of this nature.  Healing is likely being impaired by the  low platelet count with unreliable clotting, and the lesion continuing to ooze.  Repeat EGD is a very risky option as this could worsen bleeding, and would require surgical backup if needed.  Thrombocytopenia a very severe problem in either regard.  Patietn to recieve further tfx of plt and prbc this evening. Surgery is a poor option as well in regard to the nature of the proceedure and the debilitation of the patient.  I have discussed the case with Dr Delana Meyer for possible place of microembolization, and this may be an option.  He will see the patient in consult later this evening.   2) metastatic bone cancer-source uncertain.  H/O following.    Plan As above, continue iv ppi.  I am unsure whether the octreotid is of benefits currently, though likely reducing  the portal pressure some. case discussed with Dr Inez Pilgrim and Dr Delana Meyer.   Electronic Signatures: Loistine Simas (MD)   (Signed 21-Oct-13 18:14)  Authored: Chief Complaint, VITAL SIGNS/ANCILLARY NOTES, Brief Assessment, Lab Results, Assessment/Plan   Last Updated: 21-Oct-13 18:14 by Loistine Simas (MD)

## 2014-10-02 NOTE — Consult Note (Signed)
Chief Complaint:   Subjective/Chief Complaint Please see EGD report.  Severe DU in the bulb, clot in place, not bleeding or oozing.  Continue bid iv ppi, clears for 2 days before advancing to fulls for a week.  Will check h. pylori serology.  Will get abd us re elevated AP.  Will follow with you.   VITAL SIGNS/ANCILLARY NOTES: **Vital Signs.:   09-Oct-13 05:22   Vital Signs Type Routine   Temperature Temperature (F) 97.2   Celsius 36.2   Temperature Source Oral   Pulse Pulse 87   Respirations Respirations 24   Systolic BP Systolic BP 136   Diastolic BP (mmHg) Diastolic BP (mmHg) 60   Mean BP 85   Pulse Ox % Pulse Ox % 96   Pulse Ox Activity Level  At rest   Oxygen Delivery Room Air/ 21 %   Electronic Signatures: Barnetta ChapelSkulskie, Julius Matus (MD)  (Signed 09-Oct-13 08:10)  Authored: Chief Complaint, VITAL SIGNS/ANCILLARY NOTES   Last Updated: 09-Oct-13 08:10 by Barnetta ChapelSkulskie, Kyliyah Stirn (MD)

## 2014-10-02 NOTE — Consult Note (Signed)
Chief Complaint:   Subjective/Chief Complaint NO ACUTE COMPLAINTS, NO CP OR SOB, NO BACK OR ABDOMINAL PAIN, SOME GENERAL WEAKNESS   VITAL SIGNS/ANCILLARY NOTES: **Vital Signs.:   11-Oct-13 15:35   Vital Signs Type Pre-Blood   Temperature Temperature (F) 98.5   Celsius 36.9   Temperature Source oral   Pulse Pulse 96   Respirations Respirations 20   Systolic BP Systolic BP 400   Diastolic BP (mmHg) Diastolic BP (mmHg) 66   Mean BP 85   BP Source  if not from Vital Sign Device non-invasive   Pulse Ox % Pulse Ox % 96   Oxygen Delivery Room Air/ 21 %   Brief Assessment:   Respiratory normal resp effort    Additional Physical Exam ALERT AND COOPERATIVE  PALLOR  NO EDEMA  NEURO NON FOCAL   Lab Results: Routine BB:  11-Oct-13 14:36    Direct Coombs, Polyspecific Negative (Result(s) reported on 25 Mar 2012 at 03:04PM.)  Routine Chem:  11-Oct-13 14:36    Glucose, Serum 94   BUN 17   Creatinine (comp)  0.52   Sodium, Serum 143   Potassium, Serum 4.2   Chloride, Serum  114   CO2, Serum  20   Calcium (Total), Serum  7.3   Anion Gap 9   Osmolality (calc) 286   eGFR (African American) >60   eGFR (Non-African American) >60 (eGFR values <11m/min/1.73 m2 may be an indication of chronic kidney disease (CKD). Calculated eGFR is useful in patients with stable renal function. The eGFR calculation will not be reliable in acutely ill patients when serum creatinine is changing rapidly. It is not useful in  patients on dialysis. The eGFR calculation may not be applicable to patients at the low and high extremes of body sizes, pregnant women, and vegetarians.)   LDH, Serum 246 (Result(s) reported on 25 Mar 2012 at 03:09PM.)  Routine Hem:  11-Oct-13 14:36    WBC (CBC) 6.9   RBC (CBC)  2.39   Hemoglobin (CBC)  7.2   Hematocrit (CBC)  20.5   Platelet Count (CBC)  65 (Result(s) reported on 25 Mar 2012 at 03:04PM.)   MCV 86   MCH 30.0   MCHC 35.0   RDW 14.0   Basophil # 0.0 (Result(s)  reported on 25 Mar 2012 at 03:04PM.)   Segmented Neutrophils 83   Lymphocytes 12   Monocytes 1   Eosinophil 1   Metamyelocyte 1   Myelocyte 2   NRBC 1   Diff Comment 1 POLYCHROMASIA   Diff Comment 2 MICROCYTES PRESENT   Diff Comment 3 PLTS VARIED IN SIZE  Result(s) reported on 25 Mar 2012 at 03:04PM.   Retic Count  4.60   Absolute Retic Count  0.1097 (Result(s) reported on 25 Mar 2012 at 03:09PM.)   Assessment/Plan:  Assessment/Plan:   Assessment 1.ANEMIA, BLOOD LOSS, UNKNOWN IF PREXISTING ANEMIA, HAVE NOW CHECKED COOMBS NEG, RETICS OK MODEST BUT BLUNTED INCREASE FOR DEGREE OF ANEMIA, SIEP AND SERUM LIGHT CHAINS PENDING, LDH NORMAL, PRIOR BILI NORMAL, NO EVIENCE OF HEMOLYSIS, CRE NORMAL  2. THROMBOCYTOPENIA, ETIOLOGY UNCLEAR, MILD LOW AT ADMISSION, DROPPED, BUT STABILIZED TODAY, LIKELY CONSUMPTION BUT PSSIBLE PREXISTING , SPLEEN IS NORMAL SIZE, SMEAR REVIEWED, SOME HELMET CELLS AND ANISOCYTES BUT NOT A SIGNIFICANT MICROANGIOPATHY AND WITH PLT STABLE CRE NORMAL AND LDH NORMAL NO EVIDENCE OF TTP. COULD BE ELEMENT OF ITP., HIV IS NEG 3. WT LOSS LIKELY DUE TO ULCERS BUT POSSIBLY SYSTEMIC DISEASE OR MALIGNANCY. 4. HIGH ALK PHOS, MORE LIKELY  BONE WITH OTHER LFTS NORMAL, COULD STILL BE EARLY OBSTRUCTIVE BILIARY/PANCREAS. SOME LEG AND ANKLE PAIN AND INTERMITTENT SCIATIC PAIN, NO OBVIOUS BONE DISEASE    Plan F/U RESULTS OF HEP C AND ELECTROPHERESIS, CHECK B12, SERIAL CBC. WOULD GET BONE SCAN. DEPENDING ON THESE RESULTS MIGHT NEED CT SCAN OR PET/CT OR BONE MARROW EXAM LATER. IF PLTS LOWER CONSIDER TRIAL OF PREDNISONE. HOLD ON ANY HEPARIN/LOVENOX   Electronic Signatures: Dallas Schimke (MD)  (Signed 11-Oct-13 22:00)  Authored: Chief Complaint, VITAL SIGNS/ANCILLARY NOTES, Brief Assessment, Lab Results, Assessment/Plan   Last Updated: 11-Oct-13 22:00 by Dallas Schimke (MD)

## 2014-10-02 NOTE — Consult Note (Signed)
Chief Complaint:   Subjective/Chief Complaint weak, denies abdominal pain or nausea.  no emesis today, no bm today.   VITAL SIGNS/ANCILLARY NOTES: **Vital Signs.:   24-Oct-13 13:17   Vital Signs Type Routine   Temperature Temperature (F) 98   Celsius 36.6   Pulse Pulse 92   Systolic BP Systolic BP 120   Diastolic BP (mmHg) Diastolic BP (mmHg) 68   Mean BP 85   Pulse Ox % Pulse Ox % 98   Pulse Ox Activity Level  At rest   Oxygen Delivery Room Air/ 21 %   Brief Assessment:   Cardiac Regular    Respiratory clear BS    Gastrointestinal details normal Soft  Nontender  Nondistended  No masses palpable  Bowel sounds normal  mild firmness in the lower abdomen.   Lab Results:  Hepatic:  24-Oct-13 05:58    Albumin, Serum  2.0 (Result(s) reported on 07 Apr 2012 at 06:30AM.)  Routine Chem:  24-Oct-13 05:58    Result Comment Schistocytes - SLIDE PREVIOUSLY REVIEWED BY PATHOLOGIST PLatelet - VERIFIED BY SMEAR ESTIMATE WBC - WBC corrected for NRBCs.  Result(s) reported on 07 Apr 2012 at 06:34AM.   Phosphorus, Serum  2.1 (Result(s) reported on 07 Apr 2012 at 06:30AM.)   Magnesium, Serum 1.9 (1.8-2.4 THERAPEUTIC RANGE: 4-7 mg/dL TOXIC: > 10 mg/dL  -----------------------)   Calcium (Total), Serum  7.2 (Result(s) reported on 07 Apr 2012 at 06:30AM.)   Potassium, Serum 3.8 (Result(s) reported on 07 Apr 2012 at 06:30AM.)   Sodium, Serum 144 (Result(s) reported on 07 Apr 2012 at 06:30AM.)  Routine Coag:  24-Oct-13 08:00    Prothrombin  15.5   INR 1.2 (INR reference interval applies to patients on anticoagulant therapy. A single INR therapeutic range for coumarins is not optimal for all indications; however, the suggested range for most indications is 2.0 - 3.0. Exceptions to the INR Reference Range may include: Prosthetic heart valves, acute myocardial infarction, prevention of myocardial infarction, and combinations of aspirin and anticoagulant. The need for a higher or lower target  INR must be assessed individually. Reference: The Pharmacology and Management of the Vitamin K  antagonists: the seventh ACCP Conference on Antithrombotic and Thrombolytic Therapy. Chest.2004 Sept:126 (3suppl): [id number]. A HCT value >55% may artifactually increase the PT.  In one study,  the increase was an average of 25%. Reference:  "Effect on Routine and Special Coagulation Testing Values of Citrate Anticoagulant Adjustment in Patients with High HCT Values." American Journal of Clinical Pathology 2006;126:400-405.)  Routine Hem:  24-Oct-13 05:58    WBC (CBC) 6.8   RBC (CBC)  2.57   Hemoglobin (CBC)  7.9   Hematocrit (CBC)  23.1   Platelet Count (CBC)  42   MCV 90   MCH 30.7   MCHC 34.1   RDW  14.7   Bands 1   Segmented Neutrophils 72   Lymphocytes 12   Variant Lymphocytes 7   Monocytes 3   Eosinophil 2   Metamyelocyte 2   Myelocyte 1   NRBC 29   Diff Comment 1 SCHISTOCYTES   Diff Comment 2 ANISOCYTOSIS   Diff Comment 3 POIKILOCYTOSIS   Diff Comment 4 POLYCHROMASIA  Result(s) reported on 07 Apr 2012 at 06:34AM.   Assessment/Plan:  Assessment/Plan:   Assessment 1) duodenal ulcer, recurrent bleeding, now s/p intravascular embolization. stable over last 24 hours.  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 3) thrombocytopenia/bone marrow infiltration.    Plan as noted, continue ppi.  discussed with Dr Orlie Dakin.   Electronic Signatures: Barnetta Chapel (MD)  (Signed 24-Oct-13 16:32)  Authored: Chief Complaint, VITAL SIGNS/ANCILLARY NOTES, Brief Assessment, Lab Results, Assessment/Plan   Last Updated: 24-Oct-13 16:32 by Barnetta Chapel (MD)

## 2014-10-02 NOTE — Consult Note (Signed)
Chief Complaint:   Subjective/Chief Complaint NO ACUTE COMPLAINTS, WAX AND WANE LEG PAIN , NO SOB,   VITAL SIGNS/ANCILLARY NOTES: **Vital Signs.:   01-Nov-13 17:32   Vital Signs Type Q 4hr   Temperature Temperature (F) 97.6   Celsius 36.4   Temperature Source axillary   Pulse Pulse 82   Respirations Respirations 18   Systolic BP Systolic BP 141   Diastolic BP (mmHg) Diastolic BP (mmHg) 71   Mean BP 94   Pulse Ox % Pulse Ox % 97   Pulse Ox Activity Level  At rest   Oxygen Delivery Room Air/ 21 %   Brief Assessment:   Respiratory normal resp effort  clear BS    Gastrointestinal details normal Nontender    Additional Physical Exam PALLOR, ALERT AND COOPERATIVE, NEURO GROSSLY NON FOCAL, MOOD NORMAL, CALM, AFFECT WAS OK, NOT FLAT OR OBVIOUSLY DEPRTESSED WHEN I SAW HER EARLIER TODAY, ABDO NON TENDER   Lab Results: Routine BB:  01-Nov-13 09:05    Platelets (Blood Component) Issued (Result(s) reported on 15 Apr 2012 at 03:22PM.)  Routine Chem:  01-Nov-13 04:51    Result Comment PLATELET - RESULTS VERIFIED BY REPEAT TESTING.  - NOTIFIED OF CRITICAL VALUE  - C/KAREN LESTER AT [id number] 04/15/12.PMH  - READ-BACK PROCESS PERFORMED. WBC - CORRECTED FOR NRBCS DIFFERENTIAL - SLIDE PREVIOUSLY REVIEWED BY PATHOLOGIST  Result(s) reported on 15 Apr 2012 at 06:41AM.   Magnesium, Serum 2.4 (1.8-2.4 THERAPEUTIC RANGE: 4-7 mg/dL TOXIC: > 10 mg/dL  -----------------------)   Sodium, Serum 143 (Result(s) reported on 15 Apr 2012 at [facility]06:21AM.)   Calcium (Total), Serum  7.3 (Result(s) reported on 15 Apr 2012 at [facility]06:21AM.)   Phosphorus, Serum 2.6 (Result(s) reported on 15 Apr 2012 at [facility]06:21AM.)   Potassium, Serum 4.4 (Result(s) reported on 15 Apr 2012 at [facility]06:21AM.)  Routine Hem:  01-Nov-13 04:51    WBC (CBC) 9.1   RBC (CBC)  2.60   Hemoglobin (CBC)  8.2   Hematocrit (CBC)  24.0   Platelet Count (CBC)  20   MCV 93   MCH 31.6   MCHC 34.2   RDW  18.1   Bands 8   Segmented Neutrophils 74    Lymphocytes 9   Monocytes 2   Metamyelocyte 6   Myelocyte 1   NRBC 65   Diff Comment 1 ANISOCYTOSIS   Diff Comment 2 POIKILOCYTOSIS   Diff Comment 3 POLYCHROMASIA   Diff Comment 4 SCHISTOCYTES   Diff Comment 5 PLTS VARIED IN SIZE  Result(s) reported on 15 Apr 2012 at 06:41AM.   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 HGB OK, PLTS DOWN TODAY. OVERALL LOOKS LIKE BLEEDING IS SLOWED    Plan F/U PLTS AND HGB, TRANSFUSE PRN, CONTINUE XRT, CONSIDER IF STABLE ENOUGH AND PERFORMANCE STATUS ADEQUATE TO ATTEMPT CHEMOTX LATER. PLANNING ON MONDAY TO RECONSULT [facility] SURGERY FOR  PORT FOR ACCESS TO REPLACE GROIN LINE, AND THEN TRY TO GET HOME WITH HOSPICE, PALLIATIVE CARE AND SUPPORTIVE TRANSFUSIONS PRN, POSSIBLE ATTEMPT CHEMOTX DEPENDING ON PERFORMANCE STATUS AND PATIENT WISHES. DISCUSSED TODAY WITH DR Cherlynn KaiserSAINANI.  APPRECIATE PALLIATIVE CARE INPUT   Electronic Signatures: Marin RobertsGittin, Robert G (MD)  (Signed [phone]-Nov-13 18:26)  Authored: Chief Complaint, VITAL SIGNS/ANCILLARY NOTES, Brief Assessment, Lab Results, Assessment/Plan   Last Updated: 01-Nov-13 18:26 by Marin RobertsGittin, Robert G (MD)

## 2014-10-02 NOTE — Consult Note (Signed)
Chief Complaint:   Subjective/Chief Complaint LEG PAIN UNRELIVED BY PERCOCET, NO SOB, NO BLO0D IN STOOL, TOLERATING DIET, NO TOHER COMPLAINTS   VITAL SIGNS/ANCILLARY NOTES: **Vital Signs.:   07-Nov-13 17:34   Vital Signs Type Q 4hr   Temperature Temperature (F) 97.6   Celsius 36.4   Temperature Source Oral   Pulse Pulse 83   Respirations Respirations 19   Systolic BP Systolic BP 147   Diastolic BP (mmHg) Diastolic BP (mmHg) 73   Mean BP 97   Pulse Ox % Pulse Ox % 94   Pulse Ox Activity Level  At rest   Oxygen Delivery Room Air/ 21 %   Brief Assessment:   Respiratory no use of accessory muscles    Gastrointestinal details normal Soft  Nontender    Additional Physical Exam ALERT AND COOPERATIVE  PALLOR  SYMMETRIC EDEMA UPPER EXT   Lab Results: Routine BB:  07-Nov-13 10:00    Platelets (Blood Component) Issued (Result(s) reported on 21 Apr 2012 at 12:35PM.)  Routine Chem:  07-Nov-13 07:30    Result Comment Platelet - RESULTS VERIFIED BY REPEAT TESTING.  - VERIFIED BY SMEAR ESTIMATE  - NOTIFIED OF CRITICAL VALUE  - DLQ to Zettie CooleyMary Witherspoon RN at [phone] on  - [phone].  - READ-BACK PROCESS PERFORMED.  Result(s) reported on 21 Apr 2012 at 08:05AM.  Routine Hem:  07-Nov-13 07:30    WBC (CBC) 6.5   RBC (CBC)  2.36   Hemoglobin (CBC)  7.7   Hematocrit (CBC)  21.4   Platelet Count (CBC)  21   MCV 91   MCH 32.6   MCHC 35.9   RDW  32.3   Neutrophil % 94.5   Lymphocyte % 1.7   Monocyte % 3.3   Eosinophil % 0.0   Basophil % 0.5   Neutrophil # 6.1   Lymphocyte #  0.1   Monocyte # 0.2   Eosinophil # 0.0   Basophil # 0.0   Assessment/Plan:  Assessment/Plan:   Assessment SEE ALSO EARLIER NOTES  MALIGNANT ULCER, NO LONGER BLEEDING, TX WITH XRT, DIFFUSE BM DISEASE. ANEMIA AND THROMBOCYTOPENIA, MICROANGIOPATHY, POOR VENOUS ACCESS    Plan DISCUSSED WITH MEDICINE AND VASC SURGERY   PLAN ONE UNIT PLT TRANSFUSE TOMOROW BEFORE PROCEEDURE,  THEN ANOTHER UNIT PLTS DURING  PROCEEEDURE.PLACEMENT OF DUAL LUMEN PORT.  MAINTAIN GROIN LINE FOR A FEW DAYS.  I WOULD ALSO TAPER /DC HYPERALIMENTATION, PLANNING TO GO HOME LIKELY SUNDAY 11/10. I D/C PERCOCET, STARTED OXYCODONE WITH DOSE INCREASE, AND Q 3 HRS PRN, WILL NEED TO TITRATE PAIN MEDICATIONS, POSSIBLY ADD OXYCONTIN OR DURAGESIC PRN   Electronic Signatures: Marin RobertsGittin, Robert G (MD)  (Signed [phone]-Nov-13 21:57)  Authored: Chief Complaint, VITAL SIGNS/ANCILLARY NOTES, Brief Assessment, Lab Results, Assessment/Plan   Last Updated: 07-Nov-13 21:57 by Marin RobertsGittin, Robert G (MD)

## 2014-10-02 NOTE — Consult Note (Signed)
Chief Complaint:   Subjective/Chief Complaint Patient has vomited few times last night and once this morning. The vomitus was reported to be bloody. Hemoglobin this morning was better. Patient does have some tachycardia and mild hypotension this morning which is now better.   VITAL SIGNS/ANCILLARY NOTES: **Vital Signs.:   12-Oct-13 13:41   Vital Signs Type Routine   Temperature Temperature (F) 98.6   Celsius 37   Temperature Source Oral   Pulse Pulse 235   Systolic BP Systolic BP 88   Diastolic BP (mmHg) Diastolic BP (mmHg) 47   Mean BP 60   Pulse Ox % Pulse Ox % 97   Pulse Ox Activity Level  At rest   Oxygen Delivery Room Air/ 21 %   Brief Assessment:   Additional Physical Exam No acute distress. Abdomen is soft and benign.   Lab Results: Hepatic:  12-Oct-13 01:32    Bilirubin, Total 0.6   Alkaline Phosphatase  727   SGPT (ALT) 14   SGOT (AST) 23   Total Protein, Serum  4.6   Albumin, Serum  2.5  Routine Chem:  12-Oct-13 01:32    Result Comment CBC - SMEAR SCANNED  Result(s) reported on 26 Mar 2012 at 02:50AM.   Glucose, Serum  119   BUN  19   Creatinine (comp)  0.54   Sodium, Serum 145   Potassium, Serum 3.6   Chloride, Serum  114   CO2, Serum  19   Calcium (Total), Serum  7.2   Osmolality (calc) 292   eGFR (African American) >60   eGFR (Non-African American) >60 (eGFR values <33m/min/1.73 m2 may be an indication of chronic kidney disease (CKD). Calculated eGFR is useful in patients with stable renal function. The eGFR calculation will not be reliable in acutely ill patients when serum creatinine is changing rapidly. It is not useful in  patients on dialysis. The eGFR calculation may not be applicable to patients at the low and high extremes of body sizes, pregnant women, and vegetarians.)   Anion Gap 12   Magnesium, Serum  1.4 (1.8-2.4 THERAPEUTIC RANGE: 4-7 mg/dL TOXIC: > 10 mg/dL  -----------------------)  Routine Hem:  12-Oct-13 01:32    RBC (CBC)   2.75   Hemoglobin (CBC)  8.4   Hematocrit (CBC)  23.4   MCV 85   MCH 30.7   MCHC  36.1   RDW 14.1   WBC (CBC) 6.4   Platelet Count (CBC)  53   Neutrophil % 81.5   Lymphocyte % 12.0   Monocyte % 4.9   Eosinophil % 1.2   Basophil % 0.4   Neutrophil # 5.2   Lymphocyte #  0.8   Monocyte # 0.3   Eosinophil # 0.1   Basophil # 0.0    12:39    RBC (CBC)  2.04   Hemoglobin (CBC)  6.3   Hematocrit (CBC)  17.8   MCV 87   MCH 30.7   MCHC 35.3   RDW 14.4 (Result(s) reported on 26 Mar 2012 at 01:42PM.)   Assessment/Plan:  Assessment/Plan:   Assessment UGI bleed secondary to duodenal ulcer. Patient with signs of recurrent bleeding and her hemoglobin has dropped further. Thrombocytopenia.    Plan Will start Octreotide infusion. Will change back to PPI despite concerns about further thrombocytopenia. Platelet transfusion stat. Will transfuse 2 units PRBC's stat. Consider transfer to CCU if bleeding continues. Surgical consultation. Discussed with Dr. CBurt Knack Further recommendations to follow.   Electronic Signatures: IJill Side(MD)  (Signed  12-Oct-13 14:03)  Authored: Chief Complaint, VITAL SIGNS/ANCILLARY NOTES, Brief Assessment, Lab Results, Assessment/Plan   Last Updated: 12-Oct-13 14:03 by Jill Side (MD)

## 2014-10-02 NOTE — Consult Note (Signed)
Chief Complaint:   Subjective/Chief Complaint Overall weakness and fatigue on exertion same. Hb today is 7.2, platelets 36K. Had emesis x 1 today am after taking Potassium tablet with blood. No other bleeding Sxs. No SOB.   VITAL SIGNS/ANCILLARY NOTES: **Vital Signs.:   19-Oct-13 14:00   Vital Signs Type Blood Transfusion Complete   Temperature Temperature (F) 98.8   Celsius 37.1   Pulse Pulse 87   Respirations Respirations 18   Systolic BP Systolic BP 135   Diastolic BP (mmHg) Diastolic BP (mmHg) 74   Mean BP 94   Pulse Ox % Pulse Ox % 94   Oxygen Delivery Room Air/ 21 %  *Intake and Output.:   Shift 19-Oct-13 15:00   Length of Stay Totals Intake:  24706.2 Output:  [id number]    Net:  10211.2   Brief Assessment:   Additional Physical Exam Patient is alert and oriented, NAD. Pallor + CVS: S1S2, regular Lungs - b/l good air entry Abd - soft, NT Ext - no edema   Lab Results: Routine Chem:  19-Oct-13 09:21    Result Comment WBC - SLIDE PREVIOUSLY REVIEWED BY PATHOLOGIST  Result(s) reported on 02 Apr 2012 at 10:05AM.  Routine Hem:  19-Oct-13 09:21    Platelet Count (CBC)  34   Hemoglobin (CBC)  7.4   WBC (CBC) 4.4   RBC (CBC)  2.33   Hematocrit (CBC)  20.4   MCV 88   MCH 31.7   MCHC  36.1   RDW  15.3   Neutrophil % 74.8   Lymphocyte % 16.3   Monocyte % 6.2   Eosinophil % 1.9   Basophil % 0.8   Neutrophil # 3.3   Lymphocyte #  0.7   Monocyte # 0.3   Eosinophil # 0.1   Basophil # 0.0   Bands 10   Segmented Neutrophils 62   Lymphocytes 10   Monocytes 5   Eosinophil 2   Metamyelocyte 9   Myelocyte 2   NRBC 6   Diff Comment 1 POIKILOCYTOSIS   Diff Comment 2 POLYCHROMASIA   Diff Comment 3 SCHISTOCYTES   Diff Comment 4 MICROCYTES PRESENT   Diff Comment 5 NORMAL PLT MORPHOLGY  Result(s) reported on 02 Apr 2012 at 10:05AM.   Assessment/Plan:  Assessment/Plan:   Assessment 1. ANEMIA AND THROMBOCYTOPENIA  2. DUODENAL ULCER, GI BLOOD LOSS.  3. 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  Had bloody emesis today am. Agree with continuing supportive transfusion to try and maintain platelet count at > 50K, Hb at 7 g or higher. Smear also shows c/w marrow infiltration and microangiopathy. Final bone marrow report is pending, preliminary report is s/o metastatic cancer. Patient not having any pain issues at this time. Oncology will conitnue to follow.   Electronic Signatures: Izola PricePandit, Artavis Cowie Raj (MD)  (Signed 19-Oct-13 22:07)  Authored: Chief Complaint, VITAL SIGNS/ANCILLARY NOTES, Brief Assessment, Lab Results, Assessment/Plan   Last Updated: 19-Oct-13 22:07 by Izola PricePandit, Lux Meaders Raj (MD)

## 2014-10-02 NOTE — Consult Note (Signed)
Chief Complaint:   Subjective/Chief Complaint NO ACUTE COMPLAINTS NO DIARRHEA NO MELENA NO SOB, HAD SOME EMESIS EARLIER HAD SOME PAIN EARLIER LOWER EXT   VITAL SIGNS/ANCILLARY NOTES: **Vital Signs.:   21-Oct-13 18:47   Vital Signs Type Pre-Blood   Temperature Temperature (F) 98.7   Celsius 37   Pulse Pulse 108   Respirations Respirations 20   Systolic BP Systolic BP 90   Diastolic BP (mmHg) Diastolic BP (mmHg) 53   Mean BP 65   Pulse Ox % Pulse Ox % 95   Oxygen Delivery Room Air/ 21 %    21:40   Vital Signs Type Blood Transfusion   Temperature Temperature (F) 97.9   Celsius 36.6   Temperature Source oral   Pulse Pulse 97   Respirations Respirations 19   Systolic BP Systolic BP 432   Diastolic BP (mmHg) Diastolic BP (mmHg) 70   Mean BP 82   Pulse Ox % Pulse Ox % 98   Oxygen Delivery Room Air/ 21 %   Brief Assessment:   Respiratory no use of accessory muscles    Additional Physical Exam ALERT AND COOPERATIVE PALLOR   NEURO GROSSLY NON FOCAL NO LOWER EXT EDEMA   Lab Results: Rhogam:  21-Oct-13 16:27    Notification PREVIOUSLY NOTIFIED   Notification Date NOT APPLICABLE   Notification Time NOT APPLICABLE  Result(s) reported on 04 Apr 2012 at 06:12PM.  Hepatic:  21-Oct-13 02:27    Bilirubin, Total 0.9   Alkaline Phosphatase  878   SGPT (ALT) 20   SGOT (AST)  41   Total Protein, Serum  4.2   Albumin, Serum  2.2  Routine BB:  21-Oct-13 16:26    Crossmatch Unit 1 Issued (Result(s) reported on 04 Apr 2012 at 07:15PM.)   ABO Group + Rh Type A Positive   Antibody Screen POSITIVE (Result(s) reported on 04 Apr 2012 at 05:58PM.)   Platelets (Blood Component) Ready (Result(s) reported on 04 Apr 2012 at 07:22PM.)    16:27    Antibody 1 Anti- KIDD B  Routine Chem:  21-Oct-13 02:27    Result Comment MANUAL DIFFERENTIAL - SLIDE PREVIOUSLY REVIEWED BY PATHOLOGIST  - TPL  Result(s) reported on 04 Apr 2012 at 03:44AM.   Result Comment CALCIUM - NOTIFIED OF CRITICAL VALUE  -  RESULTS VERIFIED BY REPEAT TESTING.  - CALLED TO Wayne @0300 ,04/04/12  - READ-BACK PROCESS PERFORMED.  - TPL  Result(s) reported on 04 Apr 2012 at 03:11AM.   Glucose, Serum 98   BUN 16   Creatinine (comp)  0.53   Sodium, Serum 145   Potassium, Serum  3.4   Chloride, Serum  115   CO2, Serum 21   Calcium (Total), Serum  6.9   Osmolality (calc) 290   eGFR (African American) >60   eGFR (Non-African American) >60 (eGFR values <8m/min/1.73 m2 may be an indication of chronic kidney disease (CKD). Calculated eGFR is useful in patients with stable renal function. The eGFR calculation will not be reliable in acutely ill patients when serum creatinine is changing rapidly. It is not useful in  patients on dialysis. The eGFR calculation may not be applicable to patients at the low and high extremes of body sizes, pregnant women, and vegetarians.)   Anion Gap 9  Routine Hem:  21-Oct-13 02:27    WBC (CBC) 6.4   RBC (CBC)  2.46   Hemoglobin (CBC)  7.3   Hematocrit (CBC)  20.9   Platelet Count (CBC)  43 (Result(s) reported  on 04 Apr 2012 at 03:44AM.)   MCV 85   MCH 29.6   MCHC 34.9   RDW  15.5   Bands 6   Segmented Neutrophils 66   Lymphocytes 15   Monocytes 2   Eosinophil 1   Metamyelocyte 7   Myelocyte 3   NRBC 17   Diff Comment 1 ANISOCYTOSIS   Diff Comment 2 POIKILOCYTOSIS   Diff Comment 3 PLTS VARIED IN SIZE   Assessment/Plan:  Assessment/Plan:   Assessment SEE ALSO PRIOR NOTES   , 1. ANEMIA AND THROMBOCYTOPENIA  2. DUODENAL ULCER.. 3.  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 PRELIMINARY PATH REPORT ADENOCARCINOMA, ORIGIN UNKNOWN, CONSISTENT WITH LUNG OR BREAST, ER NEG, ADDITIONAL IMMUNOSTAINS PENDING  4. RESPONSE TO TRANSFUSION  PLTS ADEQUATE BUT BLUNTED.  AS PRIOR NOTED,  SMEAR SHOWS CONSISTENT WITH MARROW INFILTRATION AND MICROANGIOPATHY, THERE IS LIKELY AN ELEMENT OF HEMOLYSIS, CERTAINLY INEFFECTIVE HEMATOPOESIS. CRE HAS REMAINED NORMAL, DO NOT SUSPECT ITP AND DO  NOT WANT TO GIVE EMPIRIC STEROIDS. DO NOT SUSPECT TTP GIVEN HX AND OTHER FINDINGS.  TODAY EVENTS OF WEEKEND, AND RECURRENT BLEEDING NOTED. [place]. DISCUSSED FOR APPROX 1 HR WITH FAMILY AND PATIENT RE ISSUES AND TX OPTIONS. I AM CONCERNED THAT CHEMOTX PRIOR TO CONTROLLING ULCER AND BLEEDING HAS SEVERE RISK OF ADDITIONAL MARROW SUPPRESSION, IN PARTICULAR THROMBOCYTOPENIA, THAT COULD LEAD TO DEVASTATING BLEED, ALSO POSSIBLE INCREASED BLEED OR RISK PERFORATION AT [facility],  . I FEEL PRIMARY SURGICAL INTERVENTION BEST, WITH CHEMOTX RESERVED FOR AFTER RECOVERY, AS NO OTHER CRITICAL SITES BULK TUMOR AT PRESENT, AND IF DECISION PATIENT POOR CANDIDATE FOR SURGERY/ANAESTHEIA,  THEN EMBOLIZATION, FOR TEMPORIZING/STABILIZING.    Plan 1   WOULD STILL MAINTAIN PLTS ABOVE 50K  2. TRANSFUSE PRN TO MAINTAIN HGB  3. BLOOD AND PLTS ARE BEING TRANSFUSED TONIGHT. I HAVE REQUESTED 2 ADDITIONAL UNITS PLTS OBTAIN TONIGHT, AVAILABLE PRN FOR BLEED , OR FOR PRE AND PERI OR POST PROCEEDURE   Electronic Signatures: Dallas Schimke (MD)  (Signed 21-Oct-13 22:52)  Authored: Chief Complaint, VITAL SIGNS/ANCILLARY NOTES, Brief Assessment, Lab Results, Assessment/Plan   Last Updated: 21-Oct-13 22:52 by Dallas Schimke (MD)
